# Patient Record
Sex: Female | Born: 1950 | Race: White | Hispanic: No | Marital: Married | Smoking: Never smoker
Health system: Southern US, Community
[De-identification: ages and names within clinical notes are randomized; demographics above are authoritative.]

## PROBLEM LIST (undated history)

## (undated) DIAGNOSIS — K6389 Other specified diseases of intestine: Secondary | ICD-10-CM

## (undated) DIAGNOSIS — R748 Abnormal levels of other serum enzymes: Secondary | ICD-10-CM

## (undated) DIAGNOSIS — Z923 Personal history of irradiation: Secondary | ICD-10-CM

## (undated) DIAGNOSIS — K52831 Collagenous colitis: Secondary | ICD-10-CM

## (undated) DIAGNOSIS — M8580 Other specified disorders of bone density and structure, unspecified site: Secondary | ICD-10-CM

## (undated) DIAGNOSIS — T7840XA Allergy, unspecified, initial encounter: Secondary | ICD-10-CM

## (undated) DIAGNOSIS — M1990 Unspecified osteoarthritis, unspecified site: Secondary | ICD-10-CM

## (undated) DIAGNOSIS — H269 Unspecified cataract: Secondary | ICD-10-CM

## (undated) DIAGNOSIS — C50211 Malignant neoplasm of upper-inner quadrant of right female breast: Secondary | ICD-10-CM

## (undated) DIAGNOSIS — C50919 Malignant neoplasm of unspecified site of unspecified female breast: Secondary | ICD-10-CM

## (undated) HISTORY — DX: Collagenous colitis: K52.831

## (undated) HISTORY — DX: Other specified disorders of bone density and structure, unspecified site: M85.80

## (undated) HISTORY — DX: Malignant neoplasm of upper-inner quadrant of right female breast: C50.211

## (undated) HISTORY — PX: COLONOSCOPY: SHX174

## (undated) HISTORY — DX: Unspecified cataract: H26.9

## (undated) HISTORY — PX: OTHER SURGICAL HISTORY: SHX169

## (undated) HISTORY — PX: TONSILLECTOMY: SUR1361

## (undated) HISTORY — PX: BLADDER REPAIR: SHX76

## (undated) HISTORY — PX: BREAST BIOPSY: SHX20

## (undated) HISTORY — PX: BREAST LUMPECTOMY: SHX2

## (undated) HISTORY — DX: Unspecified osteoarthritis, unspecified site: M19.90

## (undated) HISTORY — DX: Allergy, unspecified, initial encounter: T78.40XA

---
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
## 1999-07-26 HISTORY — PX: VAGINAL HYSTERECTOMY: SHX2639

## 2007-06-07 ENCOUNTER — Ambulatory Visit: Payer: Self-pay | Admitting: Internal Medicine

## 2007-06-20 ENCOUNTER — Ambulatory Visit: Payer: Self-pay | Admitting: Internal Medicine

## 2012-04-02 ENCOUNTER — Encounter: Payer: Self-pay | Admitting: Internal Medicine

## 2012-05-29 ENCOUNTER — Encounter: Payer: Self-pay | Admitting: Internal Medicine

## 2012-06-11 ENCOUNTER — Encounter: Payer: Self-pay | Admitting: Internal Medicine

## 2012-07-12 ENCOUNTER — Encounter: Payer: Self-pay | Admitting: Internal Medicine

## 2012-08-14 ENCOUNTER — Encounter: Payer: Self-pay | Admitting: Internal Medicine

## 2012-08-14 ENCOUNTER — Ambulatory Visit (AMBULATORY_SURGERY_CENTER): Payer: BC Managed Care – PPO

## 2012-08-14 VITALS — Ht 64.5 in | Wt 129.6 lb

## 2012-08-14 DIAGNOSIS — Z1211 Encounter for screening for malignant neoplasm of colon: Secondary | ICD-10-CM

## 2012-08-14 DIAGNOSIS — Z800 Family history of malignant neoplasm of digestive organs: Secondary | ICD-10-CM

## 2012-08-14 MED ORDER — NA SULFATE-K SULFATE-MG SULF 17.5-3.13-1.6 GM/177ML PO SOLN: 1.0000 | Freq: Once | ORAL | Status: DC

## 2012-08-24 ENCOUNTER — Ambulatory Visit (AMBULATORY_SURGERY_CENTER): Payer: BC Managed Care – PPO | Admitting: Internal Medicine

## 2012-08-24 ENCOUNTER — Encounter: Payer: Self-pay | Admitting: Internal Medicine

## 2012-08-24 VITALS — BP 122/77 | HR 67 | Temp 97.7°F | Resp 17 | Ht 64.5 in | Wt 129.0 lb

## 2012-08-24 DIAGNOSIS — Z800 Family history of malignant neoplasm of digestive organs: Secondary | ICD-10-CM

## 2012-08-24 DIAGNOSIS — Z1211 Encounter for screening for malignant neoplasm of colon: Secondary | ICD-10-CM

## 2012-08-24 MED ORDER — SODIUM CHLORIDE 0.9 % IV SOLN: 500.0000 mL | INTRAVENOUS | Status: DC

## 2012-08-24 NOTE — Progress Notes (Signed)
No complaints noted in the recovery room. Maw   

## 2012-08-24 NOTE — Progress Notes (Signed)
Patient did not experience any of the following events: a burn prior to discharge; a fall within the facility; wrong site/side/patient/procedure/implant event; or a hospital transfer or hospital admission upon discharge from the facility. (G8907) Patient did not have preoperative order for IV antibiotic SSI prophylaxis. (G8918)  

## 2012-08-24 NOTE — Op Note (Signed)
[facility] [address] [place], [address]   COLONOSCOPY PROCEDURE REPORT  PATIENT: Robyn Hobbs, Robyn Hobbs  MR#: [id number] BIRTHDATE: 24-Oct-1950 , 61  yrs. old GENDER: Female ENDOSCOPIST: Iva Boop, MD, [facility] PROCEDURE DATE:  08/24/2012 PROCEDURE:   Colonoscopy, diagnostic ASA CLASS:   Class II INDICATIONS:elevated risk screening and Patient's family history of colon cancer, distant relatives. MEDICATIONS: propofol (Diprivan) 200mg  IV, MAC sedation, administered by CRNA, and These medications were titrated to patient response per physician's verbal order  DESCRIPTION OF PROCEDURE:   After the risks benefits and alternatives of the procedure were thoroughly explained, informed consent was obtained.  A digital rectal exam revealed no abnormalities of the rectum.   The LB [id number]  endoscope was introduced through the anus and advanced to the cecum, which was identified by both the appendix and ileocecal valve. No adverse events experienced.   The quality of the prep was Suprep excellent The instrument was then slowly withdrawn as the colon was fully examined.      COLON FINDINGS: A normal appearing cecum, ileocecal valve, and appendiceal orifice were identified.  The ascending, hepatic flexure, transverse, splenic flexure, descending, sigmoid colon and rectum appeared unremarkable.  No polyps or cancers were seen. Retroflexed views revealed no abnormalities. Photo was lost.The time to cecum=4 minutes 05 seconds.  Withdrawal time=8 minutes 54 seconds.  The scope was withdrawn and the procedure completed. COMPLICATIONS: There were no complications.  ENDOSCOPIC IMPRESSION: Normal colonoscopy, excellent prep  RECOMMENDATIONS: Repeat Colonoscopy in 5 years because of family history of colon cancer in 3 grandparents   eSigned:  Iva Boop, MD, [facility] 08/24/2012 10:37 AM   cc: Dewain Penning MD and The Patient

## 2012-08-24 NOTE — Patient Instructions (Addendum)
The colonoscopy was normal with a great prep! I will plan to reassess your situation in 5 years again - guidelines are not perfect but with 3 grandparents having colon cancer this still seems reasonable.  Thank you for choosing me and [facility].  Iva Boop, MD, [facility]    You may resume your current medications today.  Please call if any questions or concerns.    YOU HAD AN ENDOSCOPIC PROCEDURE TODAY AT THE [facility]: Refer to the procedure report that was given to you for any specific questions about what was found during the examination.  If the procedure report does not answer your questions, please call your gastroenterologist to clarify.  If you requested that your care partner not be given the details of your procedure findings, then the procedure report has been included in a sealed envelope for you to review at your convenience later.  YOU SHOULD EXPECT: Some feelings of bloating in the abdomen. Passage of more gas than usual.  Walking can help get rid of the air that was put into your GI tract during the procedure and reduce the bloating. If you had a lower endoscopy (such as a colonoscopy or flexible sigmoidoscopy) you may notice spotting of blood in your stool or on the toilet paper. If you underwent a bowel prep for your procedure, then you may not have a normal bowel movement for a few days.  DIET: Your first meal following the procedure should be a light meal and then it is ok to progress to your normal diet.  A half-sandwich or bowl of soup is an example of a good first meal.  Heavy or fried foods are harder to digest and may make you feel nauseous or bloated.  Likewise meals heavy in dairy and vegetables can cause extra gas to form and this can also increase the bloating.  Drink plenty of fluids but you should avoid alcoholic beverages for 24 hours.  ACTIVITY: Your care partner should take you home directly after the procedure.  You should plan to  take it easy, moving slowly for the rest of the day.  You can resume normal activity the day after the procedure however you should NOT DRIVE or use heavy machinery for 24 hours (because of the sedation medicines used during the test).    SYMPTOMS TO REPORT IMMEDIATELY: A gastroenterologist can be reached at any hour.  During normal business hours, 8:30 AM to 5:00 PM Monday through Friday, call [phone].  After hours and on weekends, please call the GI answering service at [phone] who will take a message and have the physician on call contact you.   Following lower endoscopy (colonoscopy or flexible sigmoidoscopy):  Excessive amounts of blood in the stool  Significant tenderness or worsening of abdominal pains  Swelling of the abdomen that is new, acute  Fever of 100F or higher    Black, tarry-looking stools  FOLLOW UP: If any biopsies were taken you will be contacted by phone or by letter within the next 1-3 weeks.  Call your gastroenterologist if you have not heard about the biopsies in 3 weeks.  Our staff will call the home number listed on your records the next business day following your procedure to check on you and address any questions or concerns that you may have at that time regarding the information given to you following your procedure. This is a courtesy call and so if there is no answer at the home number  and we have not heard from you through the emergency physician on call, we will assume that you have returned to your regular daily activities without incident.  SIGNATURES/CONFIDENTIALITY: You and/or your care partner have signed paperwork which will be entered into your electronic medical record.  These signatures attest to the fact that that the information above on your After Visit Summary has been reviewed and is understood.  Full responsibility of the confidentiality of this discharge information lies with you and/or your care-partner.

## 2012-08-24 NOTE — Progress Notes (Signed)
Pt stable to RR 

## 2012-08-27 ENCOUNTER — Telehealth: Payer: Self-pay | Admitting: *Deleted

## 2012-08-27 NOTE — Telephone Encounter (Signed)
  Follow up Call-  Call back number 08/24/2012  Post procedure Call Back phone  # [phone]  Permission to leave phone message Yes     Patient questions:  Do you have a fever, pain , or abdominal swelling? no Pain Score  0 *  Have you tolerated food without any problems? yes  Have you been able to return to your normal activities? yes  Do you have any questions about your discharge instructions: Diet   no Medications  no Follow up visit  no  Do you have questions or concerns about your Care? no  Actions: * If pain score is 4 or above: No action needed, pain <4.

## 2015-12-31 DIAGNOSIS — M8580 Other specified disorders of bone density and structure, unspecified site: Secondary | ICD-10-CM | POA: Insufficient documentation

## 2016-01-01 DIAGNOSIS — K581 Irritable bowel syndrome with constipation: Secondary | ICD-10-CM | POA: Insufficient documentation

## 2016-02-17 ENCOUNTER — Other Ambulatory Visit: Payer: Self-pay | Admitting: Obstetrics and Gynecology

## 2016-02-17 DIAGNOSIS — N6310 Unspecified lump in the right breast, unspecified quadrant: Secondary | ICD-10-CM

## 2016-02-18 ENCOUNTER — Other Ambulatory Visit: Payer: BC Managed Care – PPO

## 2016-02-19 ENCOUNTER — Ambulatory Visit
Admission: RE | Admit: 2016-02-19 | Discharge: 2016-02-19 | Disposition: A | Discharge status: Home / Self Care | Payer: Medicare HMO | Source: Ambulatory Visit | Attending: Obstetrics and Gynecology | Admitting: Obstetrics and Gynecology

## 2016-02-19 ENCOUNTER — Other Ambulatory Visit: Payer: Self-pay | Admitting: Obstetrics and Gynecology

## 2016-02-19 DIAGNOSIS — N6310 Unspecified lump in the right breast, unspecified quadrant: Secondary | ICD-10-CM

## 2016-02-24 ENCOUNTER — Telehealth: Payer: Self-pay | Admitting: *Deleted

## 2016-02-24 NOTE — Telephone Encounter (Signed)
Left vm for pt to return call for [facility] on 03/02/16

## 2016-02-25 ENCOUNTER — Telehealth: Payer: Self-pay | Admitting: *Deleted

## 2016-02-25 ENCOUNTER — Encounter: Payer: Self-pay | Admitting: *Deleted

## 2016-02-25 DIAGNOSIS — C50211 Malignant neoplasm of upper-inner quadrant of right female breast: Secondary | ICD-10-CM

## 2016-02-25 DIAGNOSIS — Z170 Estrogen receptor positive status [ER+]: Secondary | ICD-10-CM

## 2016-02-25 HISTORY — DX: Malignant neoplasm of upper-inner quadrant of right female breast: C50.211

## 2016-02-25 NOTE — Telephone Encounter (Signed)
Confirmed [facility] for 03/02/16 at 1215 .  Instructions and contact information given.

## 2016-03-02 ENCOUNTER — Encounter: Payer: Self-pay | Admitting: Physical Therapy

## 2016-03-02 ENCOUNTER — Ambulatory Visit: Payer: Medicare HMO | Attending: General Surgery | Admitting: Physical Therapy

## 2016-03-02 ENCOUNTER — Ambulatory Visit (HOSPITAL_BASED_OUTPATIENT_CLINIC_OR_DEPARTMENT_OTHER): Payer: Medicare HMO | Admitting: Oncology

## 2016-03-02 ENCOUNTER — Other Ambulatory Visit (HOSPITAL_BASED_OUTPATIENT_CLINIC_OR_DEPARTMENT_OTHER): Payer: Medicare HMO

## 2016-03-02 ENCOUNTER — Encounter: Payer: Self-pay | Admitting: Genetic Counselor

## 2016-03-02 ENCOUNTER — Other Ambulatory Visit: Payer: Self-pay | Admitting: General Surgery

## 2016-03-02 ENCOUNTER — Ambulatory Visit
Admission: RE | Admit: 2016-03-02 | Discharge: 2016-03-02 | Disposition: A | Discharge status: Home / Self Care | Payer: Medicare HMO | Source: Ambulatory Visit | Attending: Radiation Oncology | Admitting: Radiation Oncology

## 2016-03-02 VITALS — BP 138/77 | HR 69 | Temp 97.7°F | Resp 20 | Ht 64.5 in | Wt 129.1 lb

## 2016-03-02 DIAGNOSIS — Z170 Estrogen receptor positive status [ER+]: Secondary | ICD-10-CM | POA: Diagnosis not present

## 2016-03-02 DIAGNOSIS — M8580 Other specified disorders of bone density and structure, unspecified site: Secondary | ICD-10-CM

## 2016-03-02 DIAGNOSIS — C50411 Malignant neoplasm of upper-outer quadrant of right female breast: Secondary | ICD-10-CM

## 2016-03-02 DIAGNOSIS — C50211 Malignant neoplasm of upper-inner quadrant of right female breast: Secondary | ICD-10-CM

## 2016-03-02 LAB — COMPREHENSIVE METABOLIC PANEL
ALBUMIN: 4.4 g/dL (ref 3.5–5.0)
ALK PHOS: 62 U/L (ref 40–150)
ALT: 26 U/L (ref 0–55)
AST: 20 U/L (ref 5–34)
Anion Gap: 10 mEq/L (ref 3–11)
BILIRUBIN TOTAL: 0.57 mg/dL (ref 0.20–1.20)
BUN: 18.8 mg/dL (ref 7.0–26.0)
CO2: 27 mEq/L (ref 22–29)
Calcium: 9.8 mg/dL (ref 8.4–10.4)
Chloride: 102 mEq/L (ref 98–109)
Creatinine: 1 mg/dL (ref 0.6–1.1)
EGFR: 57 mL/min/{1.73_m2} — ABNORMAL LOW (ref >90)
GLUCOSE: 168 mg/dL — AB (ref 70–140)
Potassium: 4 mEq/L (ref 3.5–5.1)
SODIUM: 139 meq/L (ref 136–145)
TOTAL PROTEIN: 7.3 g/dL (ref 6.4–8.3)

## 2016-03-02 LAB — CBC WITH DIFFERENTIAL/PLATELET
BASO%: 0.4 % (ref 0.0–2.0)
Basophils Absolute: 0 10*3/uL (ref 0.0–0.1)
EOS%: 1.1 % (ref 0.0–7.0)
Eosinophils Absolute: 0.1 10*3/uL (ref 0.0–0.5)
HCT: 43.4 % (ref 34.8–46.6)
HEMOGLOBIN: 14.1 g/dL (ref 11.6–15.9)
LYMPH%: 15.5 % (ref 14.0–49.7)
MCH: 28.2 pg (ref 25.1–34.0)
MCHC: 32.6 g/dL (ref 31.5–36.0)
MCV: 86.7 fL (ref 79.5–101.0)
MONO#: 0.4 10*3/uL (ref 0.1–0.9)
MONO%: 4.3 % (ref 0.0–14.0)
NEUT%: 78.7 % — ABNORMAL HIGH (ref 38.4–76.8)
NEUTROS ABS: 6.8 10*3/uL — AB (ref 1.5–6.5)
Platelets: 218 10*3/uL (ref 145–400)
RBC: 5 10*6/uL (ref 3.70–5.45)
RDW: 13.6 % (ref 11.2–14.5)
WBC: 8.7 10*3/uL (ref 3.9–10.3)
lymph#: 1.3 10*3/uL (ref 0.9–3.3)

## 2016-03-02 NOTE — Progress Notes (Signed)
Radiation Oncology         [phone]) [phone] ________________________________  Initial Outpatient consultation  Name: Robyn Hobbs MRN: [id number]  Date: 03/02/2016  DOB: 1950-12-25  KG:[id number] Jerilynn Mages, MD  Karlene Einstein, MD   REFERRING PHYSICIAN: Karlene Einstein, MD  DIAGNOSIS:    ICD-9-CM ICD-10-CM   1. 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 The patient was found to have a mass right breast on screening mammography. Biopsy showed invasive ductal carcinoma and carcinoma in situ with characteristics as described above in the diagnosis. It was ER+, PR negative, HER-2 negative. Ultrasound- appreciated a mass in the 12:30 o'clock location of the right breast: affirmed a 72m mass in the 12:30 location.  The right axilla was negative for malignancy.   The patient is here today to discuss her options for treatment and radiotherapy with me.    PAST MEDICAL HISTORY:  has a past medical history of Breast cancer of upper-inner quadrant of right female breast ([facility] (02/25/2016).    PAST SURGICAL HISTORY: Past Surgical History:  Procedure Laterality Date  . COLONOSCOPY  multiple  . TONSILLECTOMY  1958  . VAGINAL HYSTERECTOMY  2001    FAMILY HISTORY: family history includes Colon cancer in her maternal grandmother, paternal grandfather, and paternal grandmother; Kidney disease in her father.  SOCIAL HISTORY:  reports that she has never smoked. She has never used smokeless tobacco. She reports that she drinks about 1.8 oz of alcohol per week . She reports that she does not use drugs.  ALLERGIES: Morphine and related  MEDICATIONS:  Current Outpatient Prescriptions  Medication Sig Dispense  Refill  . beta carotene w/minerals (OCUVITE) tablet Take 1 tablet by mouth daily.    . calcium carbonate (OS-CAL) 600 MG TABS Take 600 mg by mouth 2 (two) times daily with a meal.    . cholecalciferol (VITAMIN D) 1000 UNITS tablet Take 2,000 Units by mouth daily.    . Multiple Vitamin (MULTIVITAMIN) tablet Take 1 tablet by mouth daily.     No current facility-administered medications for this encounter.     REVIEW OF SYSTEMS:  Notable for that above. A 15 point review of system was obtained by nursing and reviewed by me. Positive for contacts, hearing loss, hearing aid, dribbling urine, and lump in breast, otherwise negative.   Vitals with BMI 03/02/2016  Height 5' 4.5"  Weight 129 lbs 2 oz  BMI 244.0 Systolic 1347 Diastolic 77  Pulse 69  Respirations 20     PHYSICAL EXAM:  General: Alert and oriented, in no acute distress HEENT: Head is normocephalic. Extraocular movements are intact. Oropharynx is clear. Neck: Neck is supple, no palpable cervical or supraclavicular lymphadenopathy. Heart: Regular in rate and rhythm with no murmurs, rubs, or gallops. Chest: Clear to auscultation bilaterally, with no rhonchi, wheezes, or rales. Abdomen: Soft, nontender, nondistended, with no rigidity or guarding. Extremities: No cyanosis or edema. Lymphatics: see Neck Exam Skin: No concerning lesions. Musculoskeletal: symmetric strength and muscle tone throughout. Neurologic: Cranial nerves II through XII are grossly intact. No obvious focalities. Speech is fluent. Coordination is intact. Psychiatric: Judgment and insight are intact. Affect is appropriate. Breasts:  No palpable masses appreciated in the breasts or axillae.    ECOG = 0  0 - Asymptomatic (Fully active, able to carry on all predisease activities without restriction)  1 - Symptomatic but completely ambulatory (Restricted in physically strenuous activity but ambulatory and able to carry out work of a light or sedentary nature. For  example, light housework, office work)  2 - Symptomatic, <50% in bed during the day (Ambulatory and capable of all self care but unable to carry out any work activities. Up and about more than 50% of waking hours)  3 - Symptomatic, >50% in bed, but not bedbound (Capable of only limited self-care, confined to bed or chair 50% or more of waking hours)  4 - Bedbound (Completely disabled. Cannot carry on any self-care. Totally confined to bed or chair)  5 - Death   Eustace Pen MM, Creech RH, Tormey DC, et al. [phone]). "Toxicity and response criteria of the [facility]". [place] Oncol. 5 (6): 649-55   LABORATORY DATA:  Lab Results  Component Value Date   WBC 8.7 03/02/2016   HGB 14.1 03/02/2016   HCT 43.4 03/02/2016   MCV 86.7 03/02/2016   PLT 218 03/02/2016   CMP     Component Value Date/Time   NA 139 03/02/2016 1238   K 4.0 03/02/2016 1238   CO2 27 03/02/2016 1238   GLUCOSE 168 (H) 03/02/2016 1238   BUN 18.8 03/02/2016 1238   CREATININE 1.0 03/02/2016 1238   CALCIUM 9.8 03/02/2016 1238   PROT 7.3 03/02/2016 1238   ALBUMIN 4.4 03/02/2016 1238   AST 20 03/02/2016 1238   ALT 26 03/02/2016 1238   ALKPHOS 62 03/02/2016 1238   BILITOT 0.57 03/02/2016 1238         RADIOGRAPHY: Mm Diag Breast Tomo Uni Right  Result Date: 02/19/2016 CLINICAL DATA:  Status post ultrasound-guided core biopsy of mass in the 12:30 o'clock location of the right breast. EXAM: DIAGNOSTIC RIGHT MAMMOGRAM POST ULTRASOUND BIOPSY COMPARISON:  Previous exam(s). FINDINGS: Mammographic images were obtained following ultrasound guided biopsy of mass in the 12:30 o'clock location of the right breast. A ribbon shaped clip is identified in the upper inner quadrant of the right breast as expected. IMPRESSION: Tissue marker clip in the expected location following biopsy. Final Assessment: Post Procedure Mammograms for Marker Placement Electronically Signed   By: Nolon Nations M.D.   On: 02/19/2016  15:49  [place] Rt Breast Bx W Loc Dev 1st Lesion Img Bx Spec US Guide  Addendum Date: 02/22/2016   ADDENDUM REPORT: 02/22/2016 16:23 ADDENDUM: PATHOLOGY ADDENDUM: Pathology right breast 12:30 o'clock location: Invasive and in situ ductal carcinoma, likely grade 1 Pathology concordance with imaging findings: Yes Recommendation: Consultation regarding treatment plan. At the patient's request, an appointment has been made for the patient at the [facility] on 03/02/2016. At the request of the patient, I spoke with her by telephone on 02/22/2016 at 11:40 a.m. She reports doing well after the biopsy . Electronically Signed   By: Nolon Nations M.D.   On: 02/22/2016 16:23   Result Date: 02/22/2016 CLINICAL DATA:  Patient presents for ultrasound-guided core biopsy of right breast mass. EXAM: ULTRASOUND GUIDED RIGHT BREAST CORE NEEDLE BIOPSY COMPARISON:  Prior studies dated 01/2016 and earlier from [place]: I met with the patient and we discussed the procedure of ultrasound-guided biopsy, including benefits and alternatives. We discussed the high likelihood of a successful procedure. We discussed the risks  of the procedure, including infection, bleeding, tissue injury, clip migration, and inadequate sampling. Informed written consent was given. The usual time-out protocol was performed immediately prior to the procedure. Using sterile technique and 1% Lidocaine as local anesthetic, under direct ultrasound visualization, a 12 gauge spring-loaded device was used to perform biopsy of mass in the 12:30 o'clock location of the right breast using a medial approach. At the conclusion of the procedure a ribbon tissue marker clip was deployed into the biopsy cavity. Follow up 2 view mammogram was performed and dictated separately. IMPRESSION: Ultrasound guided biopsy of right breast mass. No apparent complications. Electronically Signed: By: Nolon Nations  M.D. On: 02/19/2016 15:48      IMPRESSION/PLAN:  Stage I right breast cancer, ER+  She has been discussed at our multidisciplinary tumor board.  The consensus is that she would be a good candidate for breast conservation. Her expected overall survival would be equivalent between mastectomy and breast conservation, based upon randomized controlled data. She is enthusiastic about breast conservation.  It was a pleasure meeting the patient today. We discussed the risks, benefits, and side effects of radiotherapy. I recommend radiotherapy to the right breast to reduce her risk of locoregional recurrence by 2/3.  We discussed that radiation would take approximately 4-6 weeks to complete and that I would give the patient a few weeks to heal following surgery before starting treatment planning.  If chemotherapy were to be given, this would precede radiotherapy. We spoke about acute effects including skin irritation and fatigue as well as much less common late effects including internal organ injury or irritation. We spoke about the latest technology that is used to minimize the risk of late effects for patients undergoing radiotherapy to the breast or chest wall. No guarantees of treatment were given. The patient is enthusiastic about proceeding with treatment. I look forward to participating in the patient's care.    __________________________________________   Eppie Gibson, MD    This document serves as a record of services personally performed by Eppie Gibson , MD. It was created on her behalf by Truddie Hidden, a trained medical scribe. The creation of this record is based on the scribe's personal observations and the provider's statements to them. This document has been checked and approved by the attending provider.

## 2016-03-02 NOTE — Patient Instructions (Signed)

## 2016-03-02 NOTE — Progress Notes (Signed)
[place]  [phone] [phone]     ID: CHIANTE PEDEN DOB: 1950-12-15  MR#: [id number]  QPR#:[id number]  Patient Care Team: Karlene Einstein, MD as PCP - General (Family Medicine) Autumn Messing III, MD as Consulting Physician (General Surgery) Chauncey Cruel, MD as Consulting Physician (Oncology) Eppie Gibson, MD as Attending Physician (Radiation Oncology) Luellen Pucker, MD (Obstetrics and Gynecology) Amy Martinique, MD as Consulting Physician (Dermatology) OTHER MD:  CHIEF COMPLAINT: Estrogen receptor positive breast cancer  CURRENT TREATMENT: Awaiting definitive surgery  BREAST CANCER HISTORY: Areeba had screening mammography suggesting a possible mass in the right breast and [facility] diagnostic mammography with tomography and right breast ultrasonography was performed 02/17/2016. The breast density was category C. There was set up mass in the posterior third of the upper central right breast which was not palpable by exam. Ultrasonography confirmed an irregular hypoechoic mass measuring approximately 0.9 cm. Survey of the right axilla was unremarkable.  Biopsy of the right breast mass in question 02/19/2016 showed ([id number]) an invasive ductal carcinoma, grade 1, estrogen receptor 100% positive, with strong staining intensity, progesterone receptor negative, with an MIB-1 of 2%, and no HER-2 amplification, the signals ratio being 1.00 and the number per cell 1.05.  Her subsequent history is as detailed below.  INTERVAL HISTORY: Oluwaseun was evaluated in the multidisciplinary breast cancer clinic 03/02/2016 accompanied by her husband Mikki Santee. Her case was also presented in the multidisciplinary breast cancer conference that same morning. At that time a preliminary plan was proposed: Breast conserving surgery with sentinel lymph node sampling, consideration of Oncotype, followed by adjuvant radiation. The patient did  not meet genetics criteria.  REVIEW OF SYSTEMS: There were no specific symptoms leading to the original mammogram, which was routinely scheduled. The patient denies unusual headaches, visual changes, nausea, vomiting, stiff neck, dizziness, or gait imbalance. There has been no cough, phlegm production, or pleurisy, no chest pain or pressure, and no change in bowel or bladder habits. The patient denies fever, rash, bleeding, unexplained fatigue or unexplained weight loss. She admits to presbycusis and some stress urinary incontinence. A detailed review of systems was otherwise entirely negative.   PAST MEDICAL HISTORY: Past Medical History:  Diagnosis Date  . Breast cancer of upper-inner quadrant of right female breast ([place]) 02/25/2016    PAST SURGICAL HISTORY: Past Surgical History:  Procedure Laterality Date  . COLONOSCOPY  multiple  . TONSILLECTOMY  1958  . VAGINAL HYSTERECTOMY  2001    FAMILY HISTORY Family History  Problem Relation Age of Onset  . Kidney disease Father   . Colon cancer Maternal Grandmother   . Colon cancer Paternal Grandfather   . Colon cancer Paternal Grandmother   The patient's father died at the age of 49 from kidney cancer. The patient's mother died at the age of 61. The patient had one brother, no sisters. On the mother's side both the grandmother and grandfather had colon cancer diagnosed in their 61s. The patient's brother had prostate cancer at age 1. The patient's paternal grandfather was diagnosed with lung cancer in his late 41s. There is no history of breast or ovarian cancer in the family  GYNECOLOGIC HISTORY:  No LMP recorded. Menarche age 89, first live birth age 33, the patient is [place] P3. She underwent fall abdominal hysterectomy with bilateral salpingo-oophorectomy June 2001. She took hormone replacement for less than a year, stopping in 2002. She used oral contraceptives for approximately 5  years remotely without complications.  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 Her husband Reynolds Bowl") used to work in [employer] but is now retired. Son [name] lives in [place] and is a Hydrologist for [employer], son [name] lives in [place] and is a serious of for Multimedia programmer, Jacqulynn Cadet Lives in [place] Where He Works in [employer]. The Patient Has 6 Grandchildren. She Attends a [employer].    ADVANCED DIRECTIVES: In place   HEALTH MAINTENANCE: Social History  Substance Use Topics  . Smoking status: Never Smoker  . Smokeless tobacco: Never Used  . Alcohol use 1.8 oz/week    3 Glasses of wine per week     Colonoscopy: 2014/lobe our  PAP:  Bone density: July 2017   Allergies  Allergen Reactions  . Morphine And Related Swelling    Current Outpatient Prescriptions  Medication Sig Dispense Refill  . beta carotene w/minerals (OCUVITE) tablet Take 1 tablet by mouth daily.    . calcium carbonate (OS-CAL) 600 MG TABS Take 600 mg by mouth 2 (two) times daily with a meal.    . cholecalciferol (VITAMIN D) 1000 UNITS tablet Take 2,000 Units by mouth daily.    . Multiple Vitamin (MULTIVITAMIN) tablet Take 1 tablet by mouth daily.     No current facility-administered medications for this visit.     OBJECTIVE: Middle-aged white woman who appears stated age 65:   03/02/16 1252  BP: 138/77  Pulse: 69  Resp: 20  Temp: 97.7 F (36.5 C)     Body mass index is 21.82 kg/m.    ECOG FS:0 - Asymptomatic  Ocular: Sclerae unicteric, pupils equal, round and reactive to light Ear-nose-throat: Oropharynx clear and moist Lymphatic: No cervical or supraclavicular adenopathy Lungs no rales or rhonchi, good excursion bilaterally Heart regular rate and rhythm, no murmur appreciated Abd soft, nontender, positive bowel sounds MSK no focal spinal tenderness, no joint edema Neuro: non-focal, well-oriented, appropriate affect Breasts: The right breast is status post recent  biopsy. There are no findings of concern and particularly no palpable masses. The right axilla is benign. The left breast is unremarkable.   LAB RESULTS:  CMP     Component Value Date/Time   NA 139 03/02/2016 1238   K 4.0 03/02/2016 1238   CO2 27 03/02/2016 1238   GLUCOSE 168 (H) 03/02/2016 1238   BUN 18.8 03/02/2016 1238   CREATININE 1.0 03/02/2016 1238   CALCIUM 9.8 03/02/2016 1238   PROT 7.3 03/02/2016 1238   ALBUMIN 4.4 03/02/2016 1238   AST 20 03/02/2016 1238   ALT 26 03/02/2016 1238   ALKPHOS 62 03/02/2016 1238   BILITOT 0.57 03/02/2016 1238    INo results found for: SPEP, UPEP  Lab Results  Component Value Date   WBC 8.7 03/02/2016   NEUTROABS 6.8 (H) 03/02/2016   HGB 14.1 03/02/2016   HCT 43.4 03/02/2016   MCV 86.7 03/02/2016   PLT 218 03/02/2016      Chemistry      Component Value Date/Time   NA 139 03/02/2016 1238   K 4.0 03/02/2016 1238   CO2 27 03/02/2016 1238   BUN 18.8 03/02/2016 1238   CREATININE 1.0 03/02/2016 1238      Component Value Date/Time   CALCIUM 9.8 03/02/2016 1238   ALKPHOS 62 03/02/2016 1238   AST 20 03/02/2016 1238   ALT 26 03/02/2016 1238   BILITOT 0.57 03/02/2016 1238  No results found for: LABCA2  No components found for: [id number]  No results for input(s): INR in the last 168 hours.  Urinalysis No results found for: COLORURINE, APPEARANCEUR, LABSPEC, PHURINE, GLUCOSEU, HGBUR, BILIRUBINUR, KETONESUR, PROTEINUR, UROBILINOGEN, NITRITE, LEUKOCYTESUR   STUDIES: Mm Diag Breast Tomo Uni Right  Result Date: 02/19/2016 CLINICAL DATA:  Status post ultrasound-guided core biopsy of mass in the 12:30 o'clock location of the right breast. EXAM: DIAGNOSTIC RIGHT MAMMOGRAM POST ULTRASOUND BIOPSY COMPARISON:  Previous exam(s). FINDINGS: Mammographic images were obtained following ultrasound guided biopsy of mass in the 12:30 o'clock location of the right breast. A ribbon shaped clip is identified in the upper inner quadrant of the  right breast as expected. IMPRESSION: Tissue marker clip in the expected location following biopsy. Final Assessment: Post Procedure Mammograms for Marker Placement Electronically Signed   By: Nolon Nations M.D.   On: 02/19/2016 15:49  [place] Rt Breast Bx W Loc Dev 1st Lesion Img Bx Spec US Guide  Addendum Date: 02/22/2016   ADDENDUM REPORT: 02/22/2016 16:23 ADDENDUM: PATHOLOGY ADDENDUM: Pathology right breast 12:30 o'clock location: Invasive and in situ ductal carcinoma, likely grade 1 Pathology concordance with imaging findings: Yes Recommendation: Consultation regarding treatment plan. At the patient's request, an appointment has been made for the patient at the [facility] on 03/02/2016. At the request of the patient, I spoke with her by telephone on 02/22/2016 at 11:40 a.m. She reports doing well after the biopsy . Electronically Signed   By: Nolon Nations M.D.   On: 02/22/2016 16:23   Result Date: 02/22/2016 CLINICAL DATA:  Patient presents for ultrasound-guided core biopsy of right breast mass. EXAM: ULTRASOUND GUIDED RIGHT BREAST CORE NEEDLE BIOPSY COMPARISON:  Prior studies dated 01/2016 and earlier from [place]: I met with the patient and we discussed the procedure of ultrasound-guided biopsy, including benefits and alternatives. We discussed the high likelihood of a successful procedure. We discussed the risks of the procedure, including infection, bleeding, tissue injury, clip migration, and inadequate sampling. Informed written consent was given. The usual time-out protocol was performed immediately prior to the procedure. Using sterile technique and 1% Lidocaine as local anesthetic, under direct ultrasound visualization, a 12 gauge spring-loaded device was used to perform biopsy of mass in the 12:30 o'clock location of the right breast using a medial approach. At the conclusion of the procedure a ribbon tissue marker clip  was deployed into the biopsy cavity. Follow up 2 view mammogram was performed and dictated separately. IMPRESSION: Ultrasound guided biopsy of right breast mass. No apparent complications. Electronically Signed: By: Nolon Nations M.D. On: 02/19/2016 15:48    ELIGIBLE FOR AVAILABLE RESEARCH PROTOCOL: no  ASSESSMENT: 65 y.o. [place], [place] woman status post right breast upper inner quadrant biopsy 02/19/2016 for a clinical T1b N0, tage IA s invasive ductal carcinoma, grade 1, Estrogen receptor positive, progesterone receptor and HER-2 negative, with an MIB-1 of 2.   Illnesses 1) definitive surgery pending  (2) adjuvant radiation to follow as appropriate  (3) adjuvant anti-estrogens to follow at the completion of local treatment.  PLAN: We spent the better part of today's hour-long appointment discussing the biology of breast cancer in general, and the specifics of the patient's tumor in particular. We first reviewed the fact that cancer is not one disease but more than 100 different diseases and that it is important to keep them separate-- otherwise when friends and relatives discuss their own cancer experiences with [place] confusion can result.  Similarly we explained that if breast cancer spreads to the bone or liver, the patient would not have bone cancer or liver cancer, but breast cancer in the bone and breast cancer in the liver: one cancer in three places-- not 3 different cancers which otherwise would have to be treated in 3 different ways.  We discussed the difference between local and systemic therapy. In terms of loco-regional treatment, lumpectomy plus radiation is equivalent to mastectomy as far as survival is concerned. For this reason, and because the cosmetic results are generally superior, we recommend breast conserving surgery.  We then discussed the rationale for systemic therapy. There is some risk that this cancer may have already spread to other parts of her body. Patients  frequently ask at this point about bone scans, CAT scans and PET scans to find out if they have occult breast cancer somewhere else. The problem is that in early stage disease we are much more likely to find false positives then true cancers and this would expose the patient to unnecessary procedures as well as unnecessary radiation. Scans cannot answer the question the patient really would like to know, which is whether she has microscopic disease elsewhere in her body. For those reasons we do not recommend them.  Of course we would proceed to aggressive evaluation of any symptoms that might suggest metastatic disease, but that is not the case here.  Next we went over the options for systemic therapy which are anti-estrogens, anti-HER-2 immunotherapy, and chemotherapy. Lavida does not meet criteria for anti-HER-2 immunotherapy. She is a good candidate for anti-estrogens.  The question of chemotherapy is more complicated. Chemotherapy is most effective in rapidly growing, aggressive tumors. It is much less effective in low-grade, slow growing cancers, like Jaretssi 's. For that reason we are going to request an Oncotype from the definitive surgical sample, as suggested by NCCN guidelines. That will help [place] make a definitive decision regarding chemotherapy in this case.  After much discussion, then, the plan for surgery, Oncotype testing, followed by radiation and then anti-estrogens was clarified.  Nancyt has a good understanding of the overall plan. She agrees with it. She knows the goal of treatment in her case is cure. She will call with any problems that may develop before her next visit here which will be in late October, by which time she should be done with her local treatment and ready to start antiestrogen therapy   Chauncey Cruel, MD   03/02/2016 6:58 PM Medical Oncology and Hematology [facility] [place], [place] [address] Tel. [phone]    Fax.  [phone]

## 2016-03-02 NOTE — Therapy (Signed)
[place], [place], [address] Phone: [phone]   Fax:  [phone]  Physical Therapy Evaluation  Patient Details  Name: Robyn Hobbs MRN: [id number] Date of Birth: 05-Apr-1951 Referring Provider: Dr. Autumn Messing  Encounter Date: 03/02/2016      PT End of Session - 03/02/16 1526    Visit Number 1   Number of Visits 1   PT Start Time 1316   PT Stop Time 1344   PT Time Calculation (min) 28 min   Activity Tolerance Patient tolerated treatment well   Behavior During Therapy [facility] for tasks assessed/performed      Past Medical History:  Diagnosis Date  . Breast cancer of upper-inner quadrant of right female breast ([place]) 02/25/2016    Past Surgical History:  Procedure Laterality Date  . COLONOSCOPY  multiple  . TONSILLECTOMY  1958  . VAGINAL HYSTERECTOMY  2001    There were no vitals filed for this visit.       Subjective Assessment - 03/02/16 1516    Subjective Patient reports she is here today to be seen by her medical team for her newly diagnosed right breast cancer.   Patient is accompained by: Family member   Pertinent History Patient was diagnosed on 02/11/16 with right grade 1 invasive ductal carcinoma breast cancer.  It is ER positive, PR negative, and HER2 negative.  It is located in the upper outer quadrant and measures 9 mm with a Ki67 of 2%.    Patient Stated Goals Reduce lymphedema risk and learn post op shoulder ROM HEP   Currently in Pain? No/denies            [facility] PT Assessment - 03/02/16 0001      Assessment   Medical Diagnosis Right breast cancer   Referring Provider Dr. Autumn Messing   Onset Date/Surgical Date 02/11/16   Hand Dominance Right   Prior Therapy none     Precautions   Precautions Other (comment)   Precaution Comments Active cancer     Restrictions   Weight Bearing Restrictions No     Balance Screen   Has the patient fallen in the past 6 months No   Has  the patient had a decrease in activity level because of a fear of falling?  No   Is the patient reluctant to leave their home because of a fear of falling?  No     Home Ecologist residence   Living Arrangements Spouse/significant other   Available Help at Discharge Family     Prior Function   Level of [place] Retired  But works part time Engineer, manufacturing systems    Leisure She walks 45 minutes or does 30 min on elliptical 4-5x/wk     Cognition   Overall Cognitive Status Within Functional Limits for tasks assessed     Posture/Postural Control   Posture/Postural Control No significant limitations     ROM / Strength   AROM / PROM / Strength AROM;Strength     AROM   AROM Assessment Site Shoulder;Cervical   Right/Left Shoulder Right;Left   Right Shoulder Extension 56 Degrees   Right Shoulder Flexion 151 Degrees   Right Shoulder ABduction 150 Degrees   Right Shoulder Internal Rotation 66 Degrees   Right Shoulder External Rotation 88 Degrees   Left Shoulder Extension 50 Degrees   Left Shoulder Flexion 139 Degrees   Left Shoulder ABduction 150 Degrees  Left Shoulder Internal Rotation 60 Degrees   Left Shoulder External Rotation 90 Degrees   Cervical Flexion WNL   Cervical Extension WNL   Cervical - Right Side Bend WNL   Cervical - Left Side Bend WNL   Cervical - Right Rotation WNL   Cervical - Left Rotation WNL     Strength   Overall Strength Within functional limits for tasks performed           LYMPHEDEMA/ONCOLOGY QUESTIONNAIRE - 03/02/16 1524      Type   Cancer Type right breast cancer     Lymphedema Assessments   Lymphedema Assessments Upper extremities     Right Upper Extremity Lymphedema   10 cm Proximal to Olecranon Process 26.7 cm   Olecranon Process 22.4 cm   10 cm Proximal to Ulnar Styloid Process 20.5 cm   Just Proximal to Ulnar Styloid Process 14.5 cm   Across Hand at [employer] 17.4 cm   At [place] of 2nd Digit 6.3 cm     Left Upper Extremity Lymphedema   10 cm Proximal to Olecranon Process 26.4 cm   Olecranon Process 22.5 cm   10 cm Proximal to Ulnar Styloid Process 19.8 cm   Just Proximal to Ulnar Styloid Process 13.9 cm   Across Hand at [employer] 16.6 cm   At [place] of 2nd Digit 5.5 cm       Patient was instructed today in a home exercise program today for post op shoulder range of motion. These included active assist shoulder flexion in sitting, scapular retraction, wall walking with shoulder abduction, and hands behind head external rotation.  She was encouraged to do these twice a day, holding 3 seconds and repeating 5 times when permitted by her physician.         PT Education - 03/02/16 1525    Education provided Yes   Education Details Lymphedema risk reduction and post op shoulder ROM HEP   Person(s) Educated Patient;Spouse   Methods Explanation;Demonstration;Handout   Comprehension Returned demonstration;Verbalized understanding              Breast Clinic Goals - 03/02/16 1529      Patient will be able to verbalize understanding of pertinent lymphedema risk reduction practices relevant to her diagnosis specifically related to skin care.   Time 1   Period Days   Status Achieved     Patient will be able to return demonstrate and/or verbalize understanding of the post-op home exercise program related to regaining shoulder range of motion.   Time 1   Period Days   Status Achieved     Patient will be able to verbalize understanding of the importance of attending the postoperative After Breast Cancer Class for further lymphedema risk reduction education and therapeutic exercise.   Time 1   Period Days   Status Achieved              Plan - 03/02/16 1527    Clinical Impression Statement Patient was diagnosed on 02/11/16 with right grade 1 invasive ductal carcinoma breast cancer.  It is ER positive, PR negative, and HER2  negative.  It is located in the upper outer quadrant and measures 9 mm with a Ki67 of 2%. Her multidisciplinary medical team met prior to her assessments to determine a recommended treatment plan.  She is planning to have a right lumpectomy and sentinel node biopsy followed by radiation and anti-estrogen therapy.  She may benefit from post op PT to  regain shoulder ROM and reduce lymphedema risk.  Due to her lack of comorbidities, her eval is of low complexity.   Rehab Potential Excellent   Clinical Impairments Affecting Rehab Potential none   PT Frequency One time visit   PT Treatment/Interventions Patient/family education;Therapeutic exercise   PT Next Visit Plan Will f/u as needed after surgery to determine PT needs   PT Home Exercise Plan Post op shoulder ROM HEP   Consulted and Agree with Plan of Care Patient;Family member/caregiver   Family Member Consulted husband      Patient will benefit from skilled therapeutic intervention in order to improve the following deficits and impairments:  Pain, Decreased knowledge of precautions, Impaired UE functional use, Decreased range of motion  Visit Diagnosis: Carcinoma of upper-outer quadrant of female breast, right ([place]) - Plan: PT plan of care cert/re-cert      G-Codes - 00/51/10 1530    Functional Assessment Tool Used Clinical Judgement   Functional Limitation Other PT primary   Other PT Primary Current Status (Y1117) At least 1 percent but less than 20 percent impaired, limited or restricted   Other PT Primary Goal Status (B5670) At least 1 percent but less than 20 percent impaired, limited or restricted   Other PT Primary Discharge Status (L4103) At least 1 percent but less than 20 percent impaired, limited or restricted     Patient will follow up at outpatient cancer rehab if needed following surgery.  If the patient requires physical therapy at that time, a specific plan will be dictated and sent to the referring physician for  approval. The patient was educated today on appropriate basic range of motion exercises to begin post operatively and the importance of attending the After Breast Cancer class following surgery.  Patient was educated today on lymphedema risk reduction practices as it pertains to recommendations that will benefit the patient immediately following surgery.  She verbalized good understanding.  No additional physical therapy is indicated at this time.     Problem List Patient Active Problem List   Diagnosis Date Noted  . Breast cancer of upper-inner quadrant of right female breast ([place]) 02/25/2016    Annia Friendly, PT 03/02/16 3:31 PM  [place], [place], [address] Phone: [phone]   Fax:  [phone]  Name: Robyn Hobbs MRN: [id number] Date of Birth: [birth year]

## 2016-03-03 ENCOUNTER — Encounter: Payer: Self-pay | Admitting: General Practice

## 2016-03-03 NOTE — Progress Notes (Signed)
[place] Psychosocial Distress Screening Spiritual Care  Met with Luetta and husband Mikki Santee in [facility] to introduce [place] team/resources, reviewing distress screen per protocol.  The patient scored a 3 on the Psychosocial Distress Thermometer which indicates mild distress. Also assessed for distress and other psychosocial needs.   ONCBCN DISTRESS SCREENING 03/03/2016  Screening Type Initial Screening  Distress experienced in past week (1-10) 3  Information Concerns Type Lack of info about diagnosis;Lack of info about treatment  Referral to support programs Yes  Other [place] reports good support from family and friends.  Per pt, information concerns have been resolved through [facility]. Referred her question about whether genetic testing would be applicable/beneficial to navigator [facility].  Follow up needed: No.  Pt/family aware of ongoing [place] team/programming availability and have full resource packet, but please also page if needs arise/circumstances change.  Thank you.  [place], [place], [facility] Pager [phone] Voicemail [phone]

## 2016-03-04 ENCOUNTER — Other Ambulatory Visit: Payer: Self-pay | Admitting: General Surgery

## 2016-03-04 DIAGNOSIS — C50411 Malignant neoplasm of upper-outer quadrant of right female breast: Secondary | ICD-10-CM

## 2016-03-08 ENCOUNTER — Telehealth: Payer: Self-pay | Admitting: *Deleted

## 2016-03-08 NOTE — Telephone Encounter (Signed)
  Oncology Nurse Navigator Documentation  Navigator Location: [facility] On[facility] (03/08/16 1600) Navigator Encounter Type: Telephone (03/08/16 1600) Telephone: Outgoing Call;Clinic/[facility] Follow-up (03/08/16 1600)             Barriers/Navigation Needs: No barriers at this time (03/08/16 1600)                          Time Spent with Patient: 15 (03/08/16 1600)    

## 2016-03-14 ENCOUNTER — Encounter (HOSPITAL_BASED_OUTPATIENT_CLINIC_OR_DEPARTMENT_OTHER): Payer: Self-pay | Admitting: *Deleted

## 2016-03-14 NOTE — Progress Notes (Signed)
Coming Wednesday for Boost and instructions.

## 2016-03-16 ENCOUNTER — Ambulatory Visit
Admission: RE | Admit: 2016-03-16 | Discharge: 2016-03-16 | Disposition: A | Discharge status: Home / Self Care | Payer: Medicare HMO | Source: Ambulatory Visit | Attending: General Surgery | Admitting: General Surgery

## 2016-03-16 DIAGNOSIS — C50411 Malignant neoplasm of upper-outer quadrant of right female breast: Secondary | ICD-10-CM

## 2016-03-16 NOTE — Progress Notes (Signed)
Pt given 8 oz carton of boost breeze with written and verbal instructions to drink by 630 am morning of surgery /. Drink no other liquids after midnight pt voiced understanding  Teach back used

## 2016-03-17 ENCOUNTER — Ambulatory Visit (HOSPITAL_BASED_OUTPATIENT_CLINIC_OR_DEPARTMENT_OTHER)
Admission: RE | Admit: 2016-03-17 | Discharge: 2016-03-17 | Disposition: A | Discharge status: Home / Self Care | Payer: Medicare HMO | Source: Ambulatory Visit | Attending: General Surgery | Admitting: General Surgery

## 2016-03-17 ENCOUNTER — Ambulatory Visit (HOSPITAL_COMMUNITY)
Admission: RE | Admit: 2016-03-17 | Discharge: 2016-03-17 | Disposition: A | Discharge status: Home / Self Care | Payer: Medicare HMO | Source: Ambulatory Visit | Attending: General Surgery | Admitting: General Surgery

## 2016-03-17 ENCOUNTER — Encounter (HOSPITAL_BASED_OUTPATIENT_CLINIC_OR_DEPARTMENT_OTHER): Payer: Self-pay | Admitting: *Deleted

## 2016-03-17 ENCOUNTER — Encounter (HOSPITAL_BASED_OUTPATIENT_CLINIC_OR_DEPARTMENT_OTHER)
Admission: RE | Disposition: A | Discharge status: Home / Self Care | Payer: Self-pay | Source: Ambulatory Visit | Attending: General Surgery

## 2016-03-17 ENCOUNTER — Ambulatory Visit
Admission: RE | Admit: 2016-03-17 | Discharge: 2016-03-17 | Disposition: A | Discharge status: Home / Self Care | Payer: Medicare HMO | Source: Ambulatory Visit | Attending: General Surgery | Admitting: General Surgery

## 2016-03-17 ENCOUNTER — Ambulatory Visit (HOSPITAL_BASED_OUTPATIENT_CLINIC_OR_DEPARTMENT_OTHER): Payer: Medicare HMO | Admitting: Anesthesiology

## 2016-03-17 DIAGNOSIS — D0511 Intraductal carcinoma in situ of right breast: Secondary | ICD-10-CM | POA: Insufficient documentation

## 2016-03-17 DIAGNOSIS — C50411 Malignant neoplasm of upper-outer quadrant of right female breast: Secondary | ICD-10-CM

## 2016-03-17 DIAGNOSIS — C50911 Malignant neoplasm of unspecified site of right female breast: Secondary | ICD-10-CM | POA: Diagnosis present

## 2016-03-17 DIAGNOSIS — Z170 Estrogen receptor positive status [ER+]: Secondary | ICD-10-CM | POA: Insufficient documentation

## 2016-03-17 DIAGNOSIS — Z79899 Other long term (current) drug therapy: Secondary | ICD-10-CM | POA: Insufficient documentation

## 2016-03-17 HISTORY — PX: BREAST LUMPECTOMY WITH RADIOACTIVE SEED AND SENTINEL LYMPH NODE BIOPSY: SHX6550

## 2016-03-17 SURGERY — BREAST LUMPECTOMY WITH RADIOACTIVE SEED AND SENTINEL LYMPH NODE BIOPSY
Anesthesia: General | Site: Breast | Laterality: Right

## 2016-03-17 MED ORDER — BUPIVACAINE HCL (PF) 0.25 % IJ SOLN
INTRAMUSCULAR | Status: DC | PRN
  Administered 2016-03-17: 17 mL

## 2016-03-17 MED ORDER — CHLORHEXIDINE GLUCONATE CLOTH 2 % EX PADS: 6.0000 | MEDICATED_PAD | Freq: Once | CUTANEOUS | Status: DC

## 2016-03-17 MED ORDER — DEXAMETHASONE SODIUM PHOSPHATE 4 MG/ML IJ SOLN
INTRAMUSCULAR | Status: DC | PRN
  Administered 2016-03-17: 10 mg via INTRAVENOUS

## 2016-03-17 MED ORDER — LACTATED RINGERS IV SOLN
INTRAVENOUS | Status: DC
  Administered 2016-03-17 (×2): via INTRAVENOUS

## 2016-03-17 MED ORDER — FENTANYL CITRATE (PF) 100 MCG/2ML IJ SOLN
50.0000 ug | INTRAMUSCULAR | Status: DC | PRN
  Administered 2016-03-17: 100 ug via INTRAVENOUS
  Administered 2016-03-17: 50 ug via INTRAVENOUS

## 2016-03-17 MED ORDER — LIDOCAINE 2% (20 MG/ML) 5 ML SYRINGE
INTRAMUSCULAR | Status: DC | PRN
  Administered 2016-03-17: 50 mg via INTRAVENOUS

## 2016-03-17 MED ORDER — FENTANYL CITRATE (PF) 100 MCG/2ML IJ SOLN
INTRAMUSCULAR | Status: AC
  Filled 2016-03-17: qty 2

## 2016-03-17 MED ORDER — HYDROCODONE-ACETAMINOPHEN 5-325 MG PO TABS: 1.0000 | ORAL_TABLET | Freq: Four times a day (QID) | ORAL | 0 refills | Status: DC | PRN

## 2016-03-17 MED ORDER — ONDANSETRON HCL 4 MG/2ML IJ SOLN
INTRAMUSCULAR | Status: DC | PRN
  Administered 2016-03-17: 4 mg via INTRAVENOUS

## 2016-03-17 MED ORDER — FENTANYL CITRATE (PF) 100 MCG/2ML IJ SOLN: 25.0000 ug | INTRAMUSCULAR | Status: DC | PRN

## 2016-03-17 MED ORDER — MIDAZOLAM HCL 2 MG/2ML IJ SOLN
1.0000 mg | INTRAMUSCULAR | Status: DC | PRN
  Administered 2016-03-17: 2 mg via INTRAVENOUS
  Administered 2016-03-17: 1 mg via INTRAVENOUS

## 2016-03-17 MED ORDER — MIDAZOLAM HCL 2 MG/2ML IJ SOLN
INTRAMUSCULAR | Status: AC
  Filled 2016-03-17: qty 2

## 2016-03-17 MED ORDER — CEFAZOLIN SODIUM-DEXTROSE 2-4 GM/100ML-% IV SOLN
2.0000 g | INTRAVENOUS | Status: AC
  Administered 2016-03-17: 2 g via INTRAVENOUS

## 2016-03-17 MED ORDER — MEPERIDINE HCL 25 MG/ML IJ SOLN: 6.2500 mg | INTRAMUSCULAR | Status: DC | PRN

## 2016-03-17 MED ORDER — GLYCOPYRROLATE 0.2 MG/ML IJ SOLN: 0.2000 mg | Freq: Once | INTRAMUSCULAR | Status: DC | PRN

## 2016-03-17 MED ORDER — TECHNETIUM TC 99M SULFUR COLLOID FILTERED
1.0000 | Freq: Once | INTRAVENOUS | Status: AC | PRN
  Administered 2016-03-17: 1 via INTRADERMAL

## 2016-03-17 MED ORDER — PROPOFOL 10 MG/ML IV BOLUS
INTRAVENOUS | Status: AC
  Filled 2016-03-17: qty 20

## 2016-03-17 MED ORDER — METOCLOPRAMIDE HCL 5 MG/ML IJ SOLN: 10.0000 mg | Freq: Once | INTRAMUSCULAR | Status: DC | PRN

## 2016-03-17 MED ORDER — CEFAZOLIN SODIUM-DEXTROSE 2-4 GM/100ML-% IV SOLN
INTRAVENOUS | Status: AC
  Filled 2016-03-17: qty 100

## 2016-03-17 MED ORDER — SCOPOLAMINE 1 MG/3DAYS TD PT72: 1.0000 | MEDICATED_PATCH | Freq: Once | TRANSDERMAL | Status: DC | PRN

## 2016-03-17 MED ORDER — BUPIVACAINE-EPINEPHRINE (PF) 0.5% -1:200000 IJ SOLN
INTRAMUSCULAR | Status: DC | PRN
  Administered 2016-03-17: 30 mL via PERINEURAL

## 2016-03-17 MED ORDER — PROPOFOL 10 MG/ML IV BOLUS
INTRAVENOUS | Status: DC | PRN
  Administered 2016-03-17: 150 mg via INTRAVENOUS

## 2016-03-17 SURGICAL SUPPLY — 40 items
APPLIER CLIP 9.375 MED OPEN (MISCELLANEOUS) ×2
BLADE SURG 15 STRL LF DISP TIS (BLADE) ×1 IMPLANT
BLADE SURG 15 STRL SS (BLADE) ×1
CANISTER SUC SOCK COL 7IN (MISCELLANEOUS) IMPLANT
CANISTER SUCT 1200ML W/VALVE (MISCELLANEOUS) IMPLANT
CHLORAPREP W/TINT 26ML (MISCELLANEOUS) ×2 IMPLANT
CLIP APPLIE 9.375 MED OPEN (MISCELLANEOUS) ×1 IMPLANT
COVER BACK TABLE 60X90IN (DRAPES) ×2 IMPLANT
COVER MAYO STAND STRL (DRAPES) ×2 IMPLANT
COVER PROBE W GEL 5X96 (DRAPES) ×2 IMPLANT
DECANTER SPIKE VIAL GLASS SM (MISCELLANEOUS) IMPLANT
DEVICE DUBIN W/COMP PLATE 8390 (MISCELLANEOUS) ×2 IMPLANT
DRAPE LAPAROSCOPIC ABDOMINAL (DRAPES) ×2 IMPLANT
DRAPE UTILITY XL STRL (DRAPES) ×2 IMPLANT
ELECT COATED BLADE 2.86 ST (ELECTRODE) ×2 IMPLANT
ELECT REM PT RETURN 9FT ADLT (ELECTROSURGICAL) ×2
ELECTRODE REM PT RTRN 9FT ADLT (ELECTROSURGICAL) ×1 IMPLANT
GLOVE BIO SURGEON STRL SZ7.5 (GLOVE) ×2 IMPLANT
GOWN STRL REUS W/ TWL LRG LVL3 (GOWN DISPOSABLE) ×2 IMPLANT
GOWN STRL REUS W/TWL LRG LVL3 (GOWN DISPOSABLE) ×2
ILLUMINATOR WAVEGUIDE N/F (MISCELLANEOUS) IMPLANT
KIT MARKER MARGIN INK (KITS) ×2 IMPLANT
LIGHT WAVEGUIDE WIDE FLAT (MISCELLANEOUS) IMPLANT
LIQUID BAND (GAUZE/BANDAGES/DRESSINGS) ×4 IMPLANT
NDL SAFETY ECLIPSE 18X1.5 (NEEDLE) IMPLANT
NEEDLE HYPO 18GX1.5 SHARP (NEEDLE)
NEEDLE HYPO 25X1 1.5 SAFETY (NEEDLE) ×2 IMPLANT
NS IRRIG 1000ML POUR BTL (IV SOLUTION) IMPLANT
PACK BASIN DAY SURGERY FS (CUSTOM PROCEDURE TRAY) ×2 IMPLANT
PENCIL BUTTON HOLSTER BLD 10FT (ELECTRODE) ×2 IMPLANT
SLEEVE SCD COMPRESS KNEE MED (MISCELLANEOUS) ×2 IMPLANT
SPONGE LAP 18X18 X RAY DECT (DISPOSABLE) ×2 IMPLANT
SUT MON AB 4-0 PC3 18 (SUTURE) ×4 IMPLANT
SUT SILK 2 0 SH (SUTURE) IMPLANT
SUT VICRYL 3-0 CR8 SH (SUTURE) ×2 IMPLANT
SYR CONTROL 10ML LL (SYRINGE) ×2 IMPLANT
TOWEL OR 17X24 6PK STRL BLUE (TOWEL DISPOSABLE) ×2 IMPLANT
TOWEL OR NON WOVEN STRL DISP B (DISPOSABLE) ×2 IMPLANT
TUBE CONNECTING 20X1/4 (TUBING) IMPLANT
YANKAUER SUCT BULB TIP NO VENT (SUCTIONS) IMPLANT

## 2016-03-17 NOTE — Transfer of Care (Signed)
Immediate Anesthesia Transfer of Care Note  Patient: Robyn Hobbs  Procedure(s) Performed: Procedure(s) with comments: BREAST LUMPECTOMY WITH RADIOACTIVE SEED AND SENTINEL LYMPH NODE BIOPSY (Right) - BREAST LUMPECTOMY WITH RADIOACTIVE SEED AND SENTINEL LYMPH NODE BIOPSY  Patient Location: PACU  Anesthesia Type:GA combined with regional for post-op pain  Level of Consciousness: awake, alert  and oriented  Airway & Oxygen Therapy: Patient Spontanous Breathing and Patient connected to face mask oxygen  Post-op Assessment: Report given to RN and Post -op Vital signs reviewed and stable  Post vital signs: Reviewed and stable  Last Vitals:  Vitals:   03/17/16 0940 03/17/16 1130  BP:    Pulse: 67   Resp: 11   Temp:  36.4 C    Last Pain:  Vitals:   03/17/16 0851  TempSrc: Oral         Complications: No apparent anesthesia complications

## 2016-03-17 NOTE — Anesthesia Preprocedure Evaluation (Signed)
Anesthesia Evaluation  Patient identified by MRN, date of birth, ID band Patient awake    Reviewed: Allergy & Precautions, NPO status , Patient's Chart, lab work & pertinent test results  Airway Mallampati: II  TM Distance: >3 FB Neck ROM: Full    Dental no notable dental hx.    Pulmonary neg pulmonary ROS,    Pulmonary exam normal breath sounds clear to auscultation       Cardiovascular negative cardio ROS Normal cardiovascular exam Rhythm:Regular Rate:Normal     Neuro/Psych negative neurological ROS  negative psych ROS   GI/Hepatic negative GI ROS, Neg liver ROS,   Endo/Other  negative endocrine ROS  Renal/GU negative Renal ROS  negative genitourinary   Musculoskeletal negative musculoskeletal ROS (+)   Abdominal   Peds negative pediatric ROS (+)  Hematology negative hematology ROS (+)   Anesthesia Other Findings   Reproductive/Obstetrics negative OB ROS                             Anesthesia Physical Anesthesia Plan  ASA: II  Anesthesia Plan: General   Post-op Pain Management: GA combined w/ Regional for post-op pain   Induction: Intravenous  Airway Management Planned: LMA  Additional Equipment:   Intra-op Plan:   Post-operative Plan: Extubation in OR  Informed Consent: I have reviewed the patients History and Physical, chart, labs and discussed the procedure including the risks, benefits and alternatives for the proposed anesthesia with the patient or authorized representative who has indicated his/her understanding and acceptance.   Dental advisory given  Plan Discussed with: CRNA  Anesthesia Plan Comments: (pec block)        Anesthesia Quick Evaluation

## 2016-03-17 NOTE — Op Note (Signed)
03/17/2016  11:33 AM  PATIENT:  Robyn Hobbs  65 y.o. female  PRE-OPERATIVE DIAGNOSIS:  RIGHT BREAST CANCER  POST-OPERATIVE DIAGNOSIS:  RIGHT BREAST CANCER  PROCEDURE:  Procedure(s) with comments: BREAST LUMPECTOMY WITH RADIOACTIVE SEED AND SENTINEL LYMPH NODE BIOPSY (Right) SURGEON:  Surgeon(s) and Role:    * Jovita Kussmaul, MD - Primary  PHYSICIAN ASSISTANT:   ASSISTANTS: none   ANESTHESIA:   general  EBL:  Total I/O In: 1000 [I.V.:1000] Out: 10 [Blood:10]  BLOOD ADMINISTERED:none  DRAINS: none   LOCAL MEDICATIONS USED:  MARCAINE     SPECIMEN:  Source of Specimen:  right breast tissue and sentinel nodes X 3  DISPOSITION OF SPECIMEN:  PATHOLOGY  COUNTS:  YES  TOURNIQUET:  * No tourniquets in log *  DICTATION: .Dragon Dictation   After informed consent was obtained the patient was brought to the operating room and placed in the supine position on the operating room table. After adequate induction of general anesthesia the patient's right chest, breast, and axillary area were prepped with ChloraPrep, allowed to dry, and draped in usual sterile manner. An appropriate timeout was performed. Earlier in the day the patient underwent injection of 1 mCi of technetium sulfur colloid in the subareolar position on the right.Previously an I-125 seed was placed in the upper inner quadrant of the right breast to mark an area of invasive breast cancer. The neoprobe was initially set to technetium. The right axilla was examined and there was a hot spot identified.  The area was infiltrated with quarter percent Marcaine. A small transversely oriented incision was made overlying the hot spot with a 15 blade knife. The incision was carried through the skin and subcutaneous drainage tissue sharply with electrocautery until the axilla was entered. The neoprobe was used to direct blunt hemostat dissection until a hot lymph node was identified. This was excised sharply with the electrocautery and  the lymphatics were controlled with clips.2 other small palpable lymph nodes were also identified and these were also excised sharply with electrocautery and the lymphatics were controlled with clips. Ex vivo counts on sentinel node #1 was approximately 50.  There were no other hot or palpable lymph nodes identified in the right axilla. The deep layer of the incision was then closed with interrupted 3-0 Vicryl stitches. The skin was then closed with a running 4-0 Monocryl subcuticular stitch. Attention was then turned to the right breast. The neoprobe was switched to I-125. The area of radioactivity was readily identified in the upper portion of the right breast. An elliptical incision was made in the skin overlying the area of radioactivity. The incision was carried through the skin and subcutaneous drainage tissue sharply with the electrocautery. While checking the area of radioactivity frequently with the neoprobe a circular portion of breast tissue was excised sharply around the radioactive seed. The dissection was carried all the way to the chest wall. Once the specimen was removed it was oriented with the appropriate paint colors. A specimen radiograph was obtained that showed the clip and seed to be in the center of the specimen. The specimen was then sent to pathology for further evaluation. Hemostasis was achieved using the Bovie electrocautery. The cavity was marked with clips. The wound was irrigated with saline and infiltrated with quarter percent Marcaine.  The deep layer of the wound was then closed with layers of interrupted 3-0 Vicryl stitches. The skin was then closed with interrupted 4-0 Monocryl subcuticular stitches. Dermabond dressings were applied. The patient  tolerated the procedure well. At the end of the case all needle sponge and instrument counts were correct. The patient was then awakened and taken to recovery in stable condition.  PLAN OF CARE: Discharge to home after PACU  PATIENT  DISPOSITION:  PACU - hemodynamically stable.   Delay start of Pharmacological VTE agent (>24hrs) due to surgical blood loss or risk of bleeding: not applicable

## 2016-03-17 NOTE — Anesthesia Postprocedure Evaluation (Signed)
Anesthesia Post Note  Patient: Robyn Hobbs  Procedure(s) Performed: Procedure(s) (LRB): BREAST LUMPECTOMY WITH RADIOACTIVE SEED AND SENTINEL LYMPH NODE BIOPSY (Right)  Patient location during evaluation: PACU Anesthesia Type: General Level of consciousness: awake and alert Pain management: pain level controlled Vital Signs Assessment: post-procedure vital signs reviewed and stable Respiratory status: spontaneous breathing, nonlabored ventilation, respiratory function stable and patient connected to nasal cannula oxygen Cardiovascular status: blood pressure returned to baseline and stable Postop Assessment: no signs of nausea or vomiting Anesthetic complications: no    Last Vitals:  Vitals:   03/17/16 1245 03/17/16 1256  BP: 138/75   Pulse: (!) 59 65  Resp: 10 16  Temp:      Last Pain:  Vitals:   03/17/16 1245  TempSrc:   PainSc: 1                  Montez Hageman

## 2016-03-17 NOTE — Discharge Instructions (Addendum)

## 2016-03-17 NOTE — Anesthesia Procedure Notes (Signed)
Procedure Name: LMA Insertion Date/Time: 03/17/2016 10:35 AM Performed by: Melynda Ripple D Pre-anesthesia Checklist: Patient identified, Emergency Drugs available, Suction available and Patient being monitored Patient Re-evaluated:Patient Re-evaluated prior to inductionOxygen Delivery Method: Circle system utilized Preoxygenation: Pre-oxygenation with 100% oxygen Intubation Type: IV induction Ventilation: Mask ventilation without difficulty LMA: LMA inserted LMA Size: 3.0 Number of attempts: 1 Airway Equipment and Method: Bite block Placement Confirmation: positive ETCO2 Tube secured with: Tape Dental Injury: Teeth and Oropharynx as per pre-operative assessment

## 2016-03-17 NOTE — Progress Notes (Signed)
Assisted Dr. Marcell Barlow with right ultrasound guided pectoralis block. Side rails up, monitors on throughout procedure. See vital signs in flow sheet. Tolerated Procedure well.

## 2016-03-17 NOTE — Interval H&P Note (Signed)
History and Physical Interval Note:  03/17/2016 9:52 AM  Robyn Hobbs  has presented today for surgery, with the diagnosis of RIGHT BREAST CANCER  The various methods of treatment have been discussed with the patient and family. After consideration of risks, benefits and other options for treatment, the patient has consented to  Procedure(s): BREAST LUMPECTOMY WITH RADIOACTIVE SEED AND SENTINEL LYMPH NODE BIOPSY (Right) as a surgical intervention .  The patient's history has been reviewed, patient examined, no change in status, stable for surgery.  I have reviewed the patient's chart and labs.  Questions were answered to the patient's satisfaction.     TOTH III,Ashan Cueva S

## 2016-03-17 NOTE — Anesthesia Procedure Notes (Signed)
Anesthesia Regional Block:  Pectoralis block  Pre-Anesthetic Checklist: ,, timeout performed, Correct Patient, Correct Site, Correct Laterality, Correct Procedure, Correct Position, site marked, Risks and benefits discussed,  Surgical consent,  Pre-op evaluation,  At surgeon's request and post-op pain management  Laterality: Right  Prep: Maximum Sterile Barrier Precautions used, chloraprep       Needles:  Injection technique: Single-shot  Needle Type: Echogenic Stimulator Needle     Needle Length: 10cm 10 cm Needle Gauge: 21 G    Additional Needles:  Procedures: ultrasound guided (picture in chart) Pectoralis block Narrative:  Injection made incrementally with aspirations every 5 mL.  Performed by: Personally   Additional Notes: Risks, benefits and alternative to block explained extensively.  Patient tolerated procedure well, without complications.

## 2016-03-17 NOTE — H&P (Signed)
Robyn Hobbs. [place]  Location: [facility] Patient #: [id number] DOB: [birth year] Undefined / Language: Robyn Hobbs / Race: White Female   History of Present Illness  The patient is a 65 year old female who presents with breast cancer. We are asked to see the patient in consultation by Dr. Isidore Moos to evaluate her for a new right breast cancer. The patient is a 65 year old white female who recently went for a routine screening mammogram. At that time she was found to have a mass in the 12 o clock position of the right breast. it measured 34m by u/s. 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 She was ER + and PR - and Her2 - with a Ki67 of 2%. She denies any breast pain or discharge from the nipple. She does not take any hormone replacement. 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 Hysterectomy (not due to cancer) - Complete Tonsillectomy  Diagnostic Studies History  Colonoscopy 1-5 years ago Mammogram within last year Pap Smear 1-5 years ago  Medication History  No Current Medications Medications Reconciled  Social History  Alcohol use Occasional alcohol use. Caffeine use Coffee, Tea. No drug use Tobacco use Never smoker.  Family History Arthritis Mother. Cancer Father. Colon Cancer Family Members In General. Hypertension Father. Kidney Disease Father. Prostate Cancer Brother. Respiratory Condition Family Members In General.  Pregnancy / Birth History  Age at menarche 89years. Age of menopause 51-55 Contraceptive History Oral contraceptives. Gravida 3 Length (months) of breastfeeding 7-12 Maternal age 67-25Para 3    Review of Systems General Not Present- Appetite Loss, Chills, Fatigue, Fever, Night Sweats, Weight Gain and Weight Loss. HEENT Present- Hearing Loss and Wears glasses/contact lenses.  Not Present- Earache, Hoarseness, Nose Bleed, Oral Ulcers, Ringing in the Ears, Seasonal Allergies, Sinus Pain, Sore Throat, Visual Disturbances and Yellow Eyes. Respiratory Not Present- Bloody sputum, Chronic Cough, Difficulty Breathing, Snoring and Wheezing. Breast Present- Breast Mass. Not Present- Breast Pain, Nipple Discharge and Skin Changes. Cardiovascular Not Present- Chest Pain, Difficulty Breathing Lying Down, Leg Cramps, Palpitations, Rapid Heart Rate, Shortness of Breath and Swelling of Extremities. Gastrointestinal Not Present- Abdominal Pain, Bloating, Bloody Stool, Change in Bowel Habits, Chronic diarrhea, Constipation, Difficulty Swallowing, Excessive gas, Gets full quickly at meals, Hemorrhoids, Indigestion, Nausea, Rectal Pain and Vomiting. Female Genitourinary Not Present- Frequency, Nocturia, Painful Urination, Pelvic Pain and Urgency. Musculoskeletal Not Present- Back Pain, Joint Pain, Joint Stiffness, Muscle Pain, Muscle Weakness and Swelling of Extremities. Neurological Not Present- Decreased Memory, Fainting, Headaches, Numbness, Seizures, Tingling, Tremor, Trouble walking and Weakness. Psychiatric Not Present- Anxiety, Bipolar, Change in Sleep Pattern, Depression, Fearful and Frequent crying. Endocrine Not Present- Cold Intolerance, Excessive Hunger, Hair Changes, Heat Intolerance, Hot flashes and New Diabetes. Hematology Not Present- Blood Thinners, Easy Bruising, Excessive bleeding, Gland problems, HIV and Persistent Infections.   Physical Exam  General Mental Status-Alert. General Appearance-Consistent with stated age. Hydration-Well hydrated. Voice-Normal.  Head and Neck Head-normocephalic, atraumatic with no lesions or palpable masses. Trachea-midline. Thyroid Gland Characteristics - normal size and consistency.  Eye Eyeball - Bilateral-Extraocular movements intact. Sclera/Conjunctiva - Bilateral-No scleral icterus.  Chest and Lung  Exam Chest and lung exam reveals -quiet, even and easy respiratory effort with no use of accessory muscles and on auscultation, normal breath sounds, no adventitious sounds and normal vocal resonance. Inspection Chest Wall - Normal.  Back - normal.  Breast Note: There is no palpable mass in either breast. There is no palpable axillary, supraclavicular, or cervical lymphadenopathy   Cardiovascular Cardiovascular examination reveals -normal heart sounds, regular rate and rhythm with no murmurs and normal pedal pulses bilaterally.  Abdomen Inspection Inspection of the abdomen reveals - No Hernias. Skin - Scar - no surgical scars. Palpation/Percussion Palpation and Percussion of the abdomen reveal - Soft, Non Tender, No Rebound tenderness, No Rigidity (guarding) and No hepatosplenomegaly. Auscultation Auscultation of the abdomen reveals - Bowel sounds normal.  Neurologic Neurologic evaluation reveals -alert and oriented x 3 with no impairment of recent or remote memory. Mental Status-Normal.  Musculoskeletal Normal Exam - Left-Upper Extremity Strength Normal and Lower Extremity Strength Normal. Normal Exam - Right-Upper Extremity Strength Normal and Lower Extremity Strength Normal.  Lymphatic Head & Neck  General Head & Neck Lymphatics: Bilateral - Description - Normal. Axillary  General Axillary Region: Bilateral - Description - Normal. Tenderness - Non Tender. Femoral & Inguinal  Generalized Femoral & Inguinal Lymphatics: Bilateral - Description - Normal. Tenderness - Non Tender.    Assessment & Plan BREAST CANCER OF UPPER-OUTER QUADRANT OF RIGHT FEMALE BREAST (C50.411) Impression: The patient appears to have a small stage I cancer in the upper portion of the right breast. I have talked to her in detail about the different options for treatment and at this point she favors breast conservation. she is also a good candidate for sentinel node mapping. I have  discussed with her in detail the risks and benefits of the surgery as well as some of the technical aspects and she understands and wishes to proceed. I will plan for a right breast radioactive seed localized lumpectomy and sentinel node mapping. Current Plans Pt Education - Breast cancer: discussed with patient and provided information.

## 2016-03-18 ENCOUNTER — Encounter (HOSPITAL_BASED_OUTPATIENT_CLINIC_OR_DEPARTMENT_OTHER): Payer: Self-pay | Admitting: General Surgery

## 2016-03-22 ENCOUNTER — Telehealth: Payer: Self-pay | Admitting: *Deleted

## 2016-03-22 NOTE — Telephone Encounter (Signed)
Ordered oncotype per Dr. Jana Hakim.  Faxed requisition to pathology and confirmed receipts with Tammy.

## 2016-04-01 ENCOUNTER — Telehealth: Payer: Self-pay | Admitting: *Deleted

## 2016-04-01 NOTE — Telephone Encounter (Signed)
Received Oncotype Score of 20/13% Called pt to discuss results. Left vm for return call. Per Dr. Jana Hakim pt to be seen on 9/22 for discussion and further treatment.

## 2016-04-15 ENCOUNTER — Telehealth: Payer: Self-pay | Admitting: Oncology

## 2016-04-15 ENCOUNTER — Ambulatory Visit (HOSPITAL_BASED_OUTPATIENT_CLINIC_OR_DEPARTMENT_OTHER): Payer: Medicare HMO | Admitting: Oncology

## 2016-04-15 VITALS — BP 117/66 | HR 77 | Temp 98.4°F | Resp 18 | Wt 128.2 lb

## 2016-04-15 DIAGNOSIS — C50211 Malignant neoplasm of upper-inner quadrant of right female breast: Secondary | ICD-10-CM

## 2016-04-15 DIAGNOSIS — Z170 Estrogen receptor positive status [ER+]: Secondary | ICD-10-CM

## 2016-04-15 NOTE — Telephone Encounter (Signed)
Gave patient avs report and appointments for novembe. Patient also given 10/13 appointment with Dr. Isidore Moos 10/13 - spoke with Suanne Marker. Next available 10/4 and patient out of town week of 10/4.

## 2016-04-15 NOTE — Progress Notes (Signed)
Robyn Hobbs  [phone] Fax:([phone]     ID: Robyn Hobbs DOB: [birth year]  MR#: [id number]  YJE#:[id number]  Patient Care Team: Karlene Einstein, MD as PCP - General (Family Medicine) Autumn Messing III, MD as Consulting Physician (General Surgery) Chauncey Cruel, MD as Consulting Physician (Oncology) Eppie Gibson, MD as Attending Physician (Radiation Oncology) Luellen Pucker, MD (Obstetrics and Gynecology) Amy Martinique, MD as Consulting Physician (Dermatology) OTHER MD:  CHIEF COMPLAINT: Estrogen receptor positive breast cancer  CURRENT TREATMENT: Adjuvant radiation pending  BREAST CANCER HISTORY: From the original intake note:  Shuntia had screening mammography suggesting a possible mass in the right breast and [facility] diagnostic mammography with tomography and right breast ultrasonography was performed 02/17/2016. The breast density was category C. There was set up mass in the posterior third of the upper central right breast which was not palpable by exam. Ultrasonography confirmed an irregular hypoechoic mass measuring approximately 0.9 cm. Survey of the right axilla was unremarkable.  Biopsy of the right breast mass in question 02/19/2016 showed ([id number]) an invasive ductal carcinoma, grade 1, estrogen receptor 100% positive, with strong staining intensity, progesterone receptor negative, with an MIB-1 of 2%, and no HER-2 amplification, the signals ratio being 1.00 and the number per cell 1.05.  Her subsequent history is as detailed below.  INTERVAL HISTORY: Robyn Hobbs returns today for follow-up of her estrogen receptor positive breast cancer. Since her last visit here she underwent right lumpectomy and sentinel lymph node sampling, on 03/17/2016. The final pathology ([id number]) showed an invasive ductal carcinoma measuring 1.2 cm, grade 1, with negative margins. All 3 sentinel lymph nodes were clear.  An  Oncotype DX was obtained from this tumor. His score of 20 predicts a 10 year risk of recurrence outside the breast of 13% if the patient's only systemic treatment is tamoxifen for 5 years. She is here today to discuss those results.   REVIEW OF SYSTEMS: Robyn Hobbs did well with her surgery, without significant problems with pain, fever, or bleeding. She is trying to get back into her earlier exercise schedule. A detailed review of systems today was otherwise entirely stable    PAST MEDICAL HISTORY: Past Medical History:  Diagnosis Date  . Breast cancer of upper-inner quadrant of right female breast ([place]) 02/25/2016    PAST SURGICAL HISTORY: Past Surgical History:  Procedure Laterality Date  . BREAST LUMPECTOMY WITH RADIOACTIVE SEED AND SENTINEL LYMPH NODE BIOPSY Right 03/17/2016   Procedure: BREAST LUMPECTOMY WITH RADIOACTIVE SEED AND SENTINEL LYMPH NODE BIOPSY;  Surgeon: Autumn Messing III, MD;  Location: [facility];  Service: General;  Laterality: Right;  BREAST LUMPECTOMY WITH RADIOACTIVE SEED AND SENTINEL LYMPH NODE BIOPSY  . COLONOSCOPY  multiple  . Excision of Melanoma  on back    . TONSILLECTOMY     and adenoidectomy  . VAGINAL HYSTERECTOMY  2001    FAMILY HISTORY Family History  Problem Relation Age of Onset  . Kidney disease Father   . Colon cancer Maternal Grandmother   . Colon cancer Paternal Grandfather   . Colon cancer Paternal Grandmother   The patient's father died at the age of 65 from kidney cancer. The patient's mother died at the age of 38. The patient had one brother, no sisters. On the mother's side both the grandmother and grandfather had colon cancer diagnosed in their 25s. The patient's brother had prostate cancer at age 8. The patient's paternal grandfather  was diagnosed with lung cancer in his late 81s. There is no history of breast or ovarian cancer in the family  GYNECOLOGIC HISTORY:  No LMP recorded. Patient is postmenopausal. Menarche age 33, first  live birth age 37, the patient is [place] P3. She underwent fall abdominal hysterectomy with bilateral salpingo-oophorectomy June 2001. She took hormone replacement for less than a year, stopping in 2002. She used oral contraceptives for approximately 5 years remotely without complications.  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 Her husband Robyn Hobbs") used to work in [employer] but is now retired. Son Robyn Hobbs lives in [place] and is a Hydrologist for [employer], son Robyn Hobbs lives in [place] and is a serious of for Multimedia programmer, Robyn Hobbs Lives in [place] Where He Works in [employer]. The Patient Has 6 Grandchildren. She Attends a [employer].    ADVANCED DIRECTIVES: In place   HEALTH MAINTENANCE: Social History  Substance Use Topics  . Smoking status: Never Smoker  . Smokeless tobacco: Never Used  . Alcohol use 1.8 oz/week    3 Glasses of wine per week     Colonoscopy: 2014/lobe our  PAP:  Bone density: July 2017   Allergies  Allergen Reactions  . Morphine And Related Swelling    Current Outpatient Prescriptions  Medication Sig Dispense Refill  . beta carotene w/minerals (OCUVITE) tablet Take 1 tablet by mouth daily.    . calcium carbonate (OS-CAL) 600 MG TABS Take 600 mg by mouth 2 (two) times daily with a meal.    . cholecalciferol (VITAMIN D) 1000 UNITS tablet Take 2,000 Units by mouth daily.    Marland Kitchen HYDROcodone-acetaminophen (NORCO) 5-325 MG tablet Take 1-2 tablets by mouth every 6 (six) hours as needed. 20 tablet 0  . Multiple Vitamin (MULTIVITAMIN) tablet Take 1 tablet by mouth daily.     No current facility-administered medications for this visit.     OBJECTIVE: Middle-aged white woman In no acute distress Vitals:   04/15/16 1425  BP: 117/66  Pulse: 77  Resp: 18  Temp: 98.4 F (36.9 C)     Body mass index is 22.01 kg/m.    ECOG FS:0 - Asymptomatic  Sclerae unicteric, pupils round and  equal Oropharynx clear and moist-- no thrush or other lesions No cervical or supraclavicular adenopathy Lungs no rales or rhonchi Heart regular rate and rhythm Abd soft, nontender, positive bowel sounds MSK no focal spinal tenderness, no upper extremity lymphedema Neuro: nonfocal, well oriented, appropriate affect Breasts: The right breast is status post recent lumpectomy. The cosmetic result is excellent. The incision is healing nicely, without dehiscence, swelling, or erythema. The right axilla is benign. The left breast is unremarkable.   LAB RESULTS:  CMP     Component Value Date/Time   NA 139 03/02/2016 1238   K 4.0 03/02/2016 1238   CO2 27 03/02/2016 1238   GLUCOSE 168 (H) 03/02/2016 1238   BUN 18.8 03/02/2016 1238   CREATININE 1.0 03/02/2016 1238   CALCIUM 9.8 03/02/2016 1238   PROT 7.3 03/02/2016 1238   ALBUMIN 4.4 03/02/2016 1238   AST 20 03/02/2016 1238   ALT 26 03/02/2016 1238   ALKPHOS 62 03/02/2016 1238   BILITOT 0.57 03/02/2016 1238    INo results found for: SPEP, UPEP  Lab Results  Component Value Date   WBC 8.7 03/02/2016   NEUTROABS 6.8 (H) 03/02/2016   HGB 14.1 03/02/2016   HCT 43.4 03/02/2016   MCV 86.7  03/02/2016   PLT 218 03/02/2016      Chemistry      Component Value Date/Time   NA 139 03/02/2016 1238   K 4.0 03/02/2016 1238   CO2 27 03/02/2016 1238   BUN 18.8 03/02/2016 1238   CREATININE 1.0 03/02/2016 1238      Component Value Date/Time   CALCIUM 9.8 03/02/2016 1238   ALKPHOS 62 03/02/2016 1238   AST 20 03/02/2016 1238   ALT 26 03/02/2016 1238   BILITOT 0.57 03/02/2016 1238       No results found for: LABCA2  No components found for: LABCA125  No results for input(s): INR in the last 168 hours.  Urinalysis No results found for: COLORURINE, APPEARANCEUR, LABSPEC, PHURINE, GLUCOSEU, HGBUR, BILIRUBINUR, KETONESUR, PROTEINUR, UROBILINOGEN, NITRITE, LEUKOCYTESUR   STUDIES: No results found.  ELIGIBLE FOR AVAILABLE RESEARCH  PROTOCOL: no  ASSESSMENT: 65 y.o. Robyn Hobbs, [place] woman status post right breast upper inner quadrant biopsy 02/19/2016 for a clinical T1b N0, clinical stage IA invasive ductal carcinoma, grade 1, estrogen receptor positive, progesterone receptor and HER-2 negative, with an MIB-1 of 2.   (1) status post right lumpectomy and sentinel lymph node sampling 05/17/2016 for a pT1c pN0, stage IA invasive ductal carcinoma, grade 1, with negative margins  (2) Oncotype DX score of 20 predicts a 10 year risk of recurrence outside the breast of 13% if the patient's only systemic treatment is tamoxifen for 5 years  (a) given the marginal predicted benefit from chemotherapy, the patient opted against that  (2) adjuvant radiation pending  (3) adjuvant anti-estrogens to follow at the completion of local treatment.  PLAN: We spent a little over 30 minutes today going over her surgical results and the meaning of her Oncotype and she understands her cancer was small, nonaggressive-looking, and slow-growing. These tumors generally do not benefit significantly from chemotherapy and based on her Oncotype alone I quoted her a risk reduction in terms of distant disease of 4%.  However we discussed the fact that the Oncotype is based on all the participants in their database taking tamoxifen for 5 years. He now know that that is not optimal. 2 simple alternatives would be tamoxifen for 10 years or anastrozole for 5 years. Either of those options or other options we discussed less extensively today would further reduce the risk by another 2-3%.  Accordingly her ultimate risk of recurrence would be closer to 10% and her benefit of chemotherapy would be closer to 3%.  Robyn Hobbs was able to understand these numbers. We also discussed the possible toxicities, side effects and complications of chemotherapy which in her case would be cyclophosphamide and docetaxel 4. After all this it was very clear to her that she did not feel a 3  or 4% risk reduction was sufficient for her to wish to undergo chemotherapy. She does not feel anxious about this decision. I am comfortable with that as well, since at the [facility] we recommend chemotherapy for risk reductions of 5% or greater. Otherwise we only recommend consideration of chemotherapy, which is what we did today in line with NCCN guidelines  Next upper [place] is radiation. When she completes that she will start antiestrogen's. Since she is status post hysterectomy and took hormone replacement for many years with no complications, tamoxifen may be a particularly good choice for her at least to start with.  She will see me again late November. She knows to call for any problems that may develop before that visit.  Chauncey Cruel, MD   04/16/2016 6:01 PM Medical Oncology and Hematology [facility] [address] [place],  [address] Tel. [phone]    Fax. [phone]

## 2016-05-04 NOTE — Progress Notes (Signed)
Location of Breast Cancer: Right Breast  Histology per Pathology Report:  02/19/16 Diagnosis Breast, right, needle core biopsy, 12:30 o'clock - INVASIVE DUCTAL CARCINOMA. - DUCTAL CARCINOMA IN SITU.  Receptor Status: ER(100%), PR (NEG), Her2-neu (NEG), Ki-(2%)  03/17/16 Diagnosis 1. Breast, lumpectomy, Right - INVASIVE DUCTAL CARCINOMA, GRADE I/III, SPANNING 1.2 CM. - DUCTAL CARCINOMA IN SITU, LOW GRADE. - LOBULAR NEOPLASIA (ATYPICAL LOBULAR HYPERPLASIA). - SURGICAL RESECTION MARGINS ARE NEGATIVE FOR CARCINOMA. - SEE ONCOLOGY TABLE BELOW. 2. Lymph node, sentinel, biopsy, Right Axillary #1 - THERE IS NO EVIDENCE OF CARCINOMA IN 1 OF 1 LYMPH NODE (0/1). 3. Lymph node, sentinel, biopsy, Right Axillary #2 - THERE IS NO EVIDENCE OF CARCINOMA IN 1 OF 1 LYMPH NODE (0/1). 4. Lymph node, sentinel, biopsy, Right Axillary #3 - THERE IS NO EVIDENCE OF CARCINOMA IN 1 OF 1 LYMPH NODE (0/1).  Did patient present with symptoms or was this found on screening mammography?: It was found on a screening mammogram.   Past/Anticipated interventions by surgeon, if any: 03/17/16 PROCEDURE:  Procedure(s) with comments: BREAST LUMPECTOMY WITH RADIOACTIVE SEED AND SENTINEL LYMPH NODE BIOPSY (Right) SURGEON:  Surgeon(s) and Role:    * Autumn Messing III, MD - Primary   Past/Anticipated interventions by medical oncology, if any:  04/15/16 Dr. Jana Hakim (1) Oncotype DX score of 20 predicts a 10 year risk of recurrence outside the breast of 13% if the patient's only systemic treatment is tamoxifen for 5 years             (a) given the marginal predicted benefit from chemotherapy, the patient opted against that (2) adjuvant radiation pending (3) adjuvant anti-estrogens to follow at the completion of local treatment.  Lymphedema issues, if any:  She denies. She has good arm mobility. She does have numbness to the posterior side of her upper arm.   Pain issues, if any: She denies.   SAFETY ISSUES:  Prior  radiation? No  Pacemaker/ICD? No  Possible current pregnancy? No  Is the patient on methotrexate?   Current Complaints / other details:   GYNECOLOGIC HISTORY:  No LMP recorded. Patient is postmenopausal. Menarche age 13, first live birth age 70, the patient is [place] P3.  She underwent fall abdominal hysterectomy with bilateral salpingo-oophorectomy June 2001.  She took hormone replacement for less than a year, stopping in 2002.  She used oral contraceptives for approximately 5 years remotely without complications.  BP 117/69   Pulse 68   Temp 98.3 F (36.8 C)   Ht '5\' 4"'$  (1.626 m)   Wt 125 lb (56.7 kg)   SpO2 98% Comment: room air  BMI 21.46 kg/m    Wt Readings from Last 3 Encounters:  05/06/16 125 lb (56.7 kg)  04/15/16 128 lb 3.2 oz (58.2 kg)  03/17/16 128 lb (58.1 kg)      Ane Conerly, Stephani Police, RN 05/04/2016,8:45 AM

## 2016-05-06 ENCOUNTER — Ambulatory Visit
Admission: RE | Admit: 2016-05-06 | Discharge: 2016-05-06 | Disposition: A | Discharge status: Home / Self Care | Payer: Medicare HMO | Source: Ambulatory Visit | Attending: Radiation Oncology | Admitting: Radiation Oncology

## 2016-05-06 ENCOUNTER — Encounter: Payer: Self-pay | Admitting: Radiation Oncology

## 2016-05-06 DIAGNOSIS — C50211 Malignant neoplasm of upper-inner quadrant of right female breast: Secondary | ICD-10-CM

## 2016-05-06 DIAGNOSIS — Z510 Encounter for antineoplastic radiation therapy: Secondary | ICD-10-CM | POA: Insufficient documentation

## 2016-05-06 DIAGNOSIS — Z170 Estrogen receptor positive status [ER+]: Secondary | ICD-10-CM | POA: Diagnosis not present

## 2016-05-06 HISTORY — DX: Abnormal levels of other serum enzymes: R74.8

## 2016-05-06 NOTE — Progress Notes (Signed)
Radiation Oncology         [phone]) [phone] ________________________________  Name: Robyn Hobbs MRN: [id number]  Date: 05/06/2016  DOB: July 30, 1950  Follow-Up Visit Note  Outpatient  CC: Jolene Provost, MD  Magrinat, Valentino Hue, MD  Diagnosis:      ICD-9-CM ICD-10-CM   1. Malignant neoplasm of upper-inner quadrant of right female breast, unspecified estrogen receptor status (HCC) 174.2 C50.211     Stage IA (pT1c, pN0) grade 1 invasive ductal carcinoma with DCIS (ER 100%, PR 0%, HER2 negative) of the right breast.  Narrative:  The patient returns today for follow-up. The patient was seen in breast clinic on 03/02/16.   Since consultation, she underwent a right lumpectomy and sentinel lymph node biopsy on 03/17/16 by Dr. Carolynne Edouard. This revealed a 1.2 cm tumor with low grade DCIS as well. Margins were all greater than at least 2 mm. The sentinel lymph nodes were all negative. She has met with Dr. Darnelle Catalan and due to a Oncotype score of 20 chemotherapy was elected not to be pursued.  The patient presents today to discuss the role of radiotherapy for the management of her disease.  The patient took hormone replacement for less than a year and stopped in 2002. She denies lymphedema or problems with arm mobility. She has numbness in the posterior right arm. She denies pain or prior radiotherapy. She underwent abdominal hysterectomy and BSO in 2001.  Her PCP noted acutely elevated AST ALT and Alk phos last month.  These will be re tested in a week  [place], Hep panel reportedly negative.  ALLERGIES:  is allergic to morphine and related.  Meds: Current Outpatient Prescriptions  Medication Sig Dispense Refill  . beta carotene w/minerals (OCUVITE) tablet Take 1 tablet by mouth daily.    . calcium carbonate (OS-CAL) 600 MG TABS Take 600 mg by mouth 2 (two) times daily with a meal.    . cholecalciferol (VITAMIN D) 1000 UNITS tablet Take 2,000 Units by mouth daily.    . Multiple Vitamin (MULTIVITAMIN)  tablet Take 1 tablet by mouth daily.     No current facility-administered medications for this encounter.     Physical Findings:  height is 5\' 4"  (1.626 m) and weight is 125 lb (56.7 kg). Her temperature is 98.3 F (36.8 C). Her blood pressure is 117/69 and her pulse is 68. Her oxygen saturation is 98%. .     General: Alert and oriented, in no acute distress HEENT: Head is normocephalic. Extraocular movements are intact. Oropharynx is clear. Neck: Neck is supple, no palpable cervical or supraclavicular lymphadenopathy. Heart: Regular in rate and rhythm with no murmurs, rubs, or gallops. Chest: Clear to auscultation bilaterally, with no rhonchi, wheezes, or rales. Abdomen: Soft, nontender, nondistended, with no rigidity or guarding. Extremities: No cyanosis or edema. Lymphatics: see Neck Exam Musculoskeletal: symmetric strength and muscle tone throughout. Neurologic: No obvious focalities. Speech is fluent.  Psychiatric: Judgment and insight are intact. Affect is appropriate. Breast exam reveals well healed right breast lumpectomy and right axillary scars.  Lab Findings: Lab Results  Component Value Date   WBC 8.7 03/02/2016   HGB 14.1 03/02/2016   HCT 43.4 03/02/2016   MCV 86.7 03/02/2016   PLT 218 03/02/2016   CMP     Component Value Date/Time   NA 139 03/02/2016 1238   K 4.0 03/02/2016 1238   CO2 27 03/02/2016 1238   GLUCOSE 168 (H) 03/02/2016 1238   BUN 18.8 03/02/2016 1238   CREATININE 1.0 03/02/2016  1238   CALCIUM 9.8 03/02/2016 1238   PROT 7.3 03/02/2016 1238   ALBUMIN 4.4 03/02/2016 1238   AST 20 03/02/2016 1238   ALT 26 03/02/2016 1238   ALKPHOS 62 03/02/2016 1238   BILITOT 0.57 03/02/2016 1238    Radiographic Findings: No results found.  Impression/Plan: Stage I right breast cancer  We discussed adjuvant radiotherapy today.  I recommend 3 1/2 weeks of radiotherapy to the right breast in order to reduce the risk of locoregional recurrence by 2/3rds.  The  risks, benefits and side effects of this treatment were discussed in detail.  She understands that radiotherapy is associated with skin irritation and fatigue in the acute setting. Late effects can include cosmetic changes and rare injury to internal organs. She is enthusiastic about proceeding with treatment. A consent form has been signed and placed in her chart.  Simulation next week.  I spent 30  minutes face to face with the patient and more than 50% of that time was spent in counseling and/or coordination of care. _____________________________________   Eppie Gibson, MD

## 2016-05-09 NOTE — Progress Notes (Signed)
  Radiation Oncology         [phone] ________________________________  Name: Robyn Hobbs MRN: [id number]  Date: 05/10/2016  DOB: 04/12/1951  SIMULATION AND TREATMENT PLANNING NOTE    Outpatient  DIAGNOSIS:     ICD-9-CM ICD-10-CM   1. Malignant neoplasm of upper-inner quadrant of right breast in female, estrogen receptor positive ([place]) 174.2 C50.211    V86.0 Z17.0     NARRATIVE:  The patient was brought to the [facility].  Identity was confirmed.  All relevant records and images related to the planned course of therapy were reviewed.  The patient freely provided informed written consent to proceed with treatment after reviewing the details related to the planned course of therapy. The consent form was witnessed and verified by the simulation staff.    Then, the patient was set-up in a stable reproducible supine position for radiation therapy with her ipsilateral arm over her head, and her upper body secured in a custom-made Vac-lok device.  CT images were obtained.  Surface markings were placed.  The CT images were loaded into the planning software.    TREATMENT PLANNING NOTE: Treatment planning then occurred.  The radiation prescription was entered and confirmed.     A total of 3 medically necessary complex treatment devices were fabricated and supervised by me: 2 fields with MLCs for custom blocks to protect heart, and lungs;  and, a Vac-lok. MORE COMPLEX DEVICES MAY BE MADE IN DOSIMETRY FOR FIELD IN FIELD BEAMS FOR DOSE HOMOGENEITY.  I have requested : 3D Simulation  I have requested a DVH of the following structures: lungs, heart, lumpectomy cavity.    The patient will receive 42.56 Gy in 16 fractions to the right breast with 2 tangential fields.   This will not be followed by a boost.  Optical Surface Tracking Plan:  Since intensity modulated radiotherapy (IMRT) and 3D conformal radiation treatment methods are predicated on accurate and precise positioning for  treatment, intrafraction motion monitoring is medically necessary to ensure accurate and safe treatment delivery. The ability to quantify intrafraction motion without excessive ionizing radiation dose can only be performed with optical surface tracking. Accordingly, surface imaging offers the opportunity to obtain 3D measurements of patient position throughout IMRT and 3D treatments without excessive radiation exposure. I am ordering optical surface tracking for this patient's upcoming course of radiotherapy.  ________________________________   Reference:  Ursula Alert, J, et al. Surface imaging-based analysis of intrafraction motion for breast radiotherapy patients.Journal of [place], n. 6, nov. 2014. ISSN [id number].  Available at: <http://www.jacmp.org/index.php/jacmp/article/view/4957>.    -----------------------------------  Eppie Gibson, MD

## 2016-05-10 ENCOUNTER — Ambulatory Visit
Admission: RE | Admit: 2016-05-10 | Discharge: 2016-05-10 | Disposition: A | Discharge status: Home / Self Care | Payer: Medicare HMO | Source: Ambulatory Visit | Attending: Radiation Oncology | Admitting: Radiation Oncology

## 2016-05-10 DIAGNOSIS — Z170 Estrogen receptor positive status [ER+]: Principal | ICD-10-CM

## 2016-05-10 DIAGNOSIS — Z510 Encounter for antineoplastic radiation therapy: Secondary | ICD-10-CM | POA: Diagnosis not present

## 2016-05-10 DIAGNOSIS — C50211 Malignant neoplasm of upper-inner quadrant of right female breast: Secondary | ICD-10-CM

## 2016-05-16 DIAGNOSIS — Z510 Encounter for antineoplastic radiation therapy: Secondary | ICD-10-CM | POA: Diagnosis not present

## 2016-05-17 ENCOUNTER — Ambulatory Visit
Admission: RE | Admit: 2016-05-17 | Discharge: 2016-05-17 | Disposition: A | Discharge status: Home / Self Care | Payer: Medicare HMO | Source: Ambulatory Visit | Attending: Radiation Oncology | Admitting: Radiation Oncology

## 2016-05-17 DIAGNOSIS — Z170 Estrogen receptor positive status [ER+]: Principal | ICD-10-CM

## 2016-05-17 DIAGNOSIS — C50211 Malignant neoplasm of upper-inner quadrant of right female breast: Secondary | ICD-10-CM

## 2016-05-17 DIAGNOSIS — Z510 Encounter for antineoplastic radiation therapy: Secondary | ICD-10-CM | POA: Diagnosis not present

## 2016-05-17 MED ORDER — RADIAPLEXRX EX GEL
Freq: Once | CUTANEOUS | Status: AC
  Administered 2016-05-17: 18:00:00 via TOPICAL

## 2016-05-17 MED ORDER — ALRA NON-METALLIC DEODORANT (RAD-ONC)
1.0000 "application " | Freq: Once | TOPICAL | Status: AC
  Administered 2016-05-17: 1 via TOPICAL

## 2016-05-17 NOTE — Progress Notes (Signed)

## 2016-05-18 ENCOUNTER — Ambulatory Visit
Admission: RE | Admit: 2016-05-18 | Discharge: 2016-05-18 | Disposition: A | Discharge status: Home / Self Care | Payer: Medicare HMO | Source: Ambulatory Visit | Attending: Radiation Oncology | Admitting: Radiation Oncology

## 2016-05-18 ENCOUNTER — Ambulatory Visit: Payer: Medicare HMO | Admitting: Oncology

## 2016-05-18 DIAGNOSIS — Z510 Encounter for antineoplastic radiation therapy: Secondary | ICD-10-CM | POA: Diagnosis not present

## 2016-05-19 ENCOUNTER — Ambulatory Visit
Admission: RE | Admit: 2016-05-19 | Discharge: 2016-05-19 | Disposition: A | Discharge status: Home / Self Care | Payer: Medicare HMO | Source: Ambulatory Visit | Attending: Radiation Oncology | Admitting: Radiation Oncology

## 2016-05-19 DIAGNOSIS — Z510 Encounter for antineoplastic radiation therapy: Secondary | ICD-10-CM | POA: Diagnosis not present

## 2016-05-20 ENCOUNTER — Ambulatory Visit
Admission: RE | Admit: 2016-05-20 | Discharge: 2016-05-20 | Disposition: A | Discharge status: Home / Self Care | Payer: Medicare HMO | Source: Ambulatory Visit | Attending: Radiation Oncology | Admitting: Radiation Oncology

## 2016-05-20 DIAGNOSIS — Z510 Encounter for antineoplastic radiation therapy: Secondary | ICD-10-CM | POA: Diagnosis not present

## 2016-05-23 ENCOUNTER — Encounter: Payer: Self-pay | Admitting: Radiation Oncology

## 2016-05-23 ENCOUNTER — Ambulatory Visit
Admission: RE | Admit: 2016-05-23 | Discharge: 2016-05-23 | Disposition: A | Discharge status: Home / Self Care | Payer: Medicare HMO | Source: Ambulatory Visit | Attending: Radiation Oncology | Admitting: Radiation Oncology

## 2016-05-23 VITALS — BP 106/76 | HR 69 | Temp 98.0°F | Resp 16 | Wt 126.0 lb

## 2016-05-23 DIAGNOSIS — Z170 Estrogen receptor positive status [ER+]: Principal | ICD-10-CM

## 2016-05-23 DIAGNOSIS — Z510 Encounter for antineoplastic radiation therapy: Secondary | ICD-10-CM | POA: Diagnosis not present

## 2016-05-23 DIAGNOSIS — C50211 Malignant neoplasm of upper-inner quadrant of right female breast: Secondary | ICD-10-CM

## 2016-05-23 MED ORDER — RADIAPLEXRX EX GEL: Freq: Once | CUTANEOUS | Status: DC

## 2016-05-23 MED ORDER — ALRA NON-METALLIC DEODORANT (RAD-ONC): 1.0000 "application " | Freq: Once | TOPICAL | Status: DC

## 2016-05-23 NOTE — Progress Notes (Signed)
Weekly rad txs  Right breast 4/16 completed, no skin changes, incision scar pink, skin is intact, using radiaplex bid, no c/o pain,appetite, good,  1:57 PM BP 106/76 (BP Location: Left Arm, Patient Position: Sitting, Cuff Size: Normal)   Pulse 69   Temp 98 F (36.7 C) (Oral)   Resp 16   Wt 126 lb (57.2 kg)   BMI 21.63 kg/m   Wt Readings from Last 3 Encounters:  05/23/16 126 lb (57.2 kg)  05/06/16 125 lb (56.7 kg)  04/15/16 128 lb 3.2 oz (58.2 kg)

## 2016-05-23 NOTE — Progress Notes (Signed)
   Weekly Management Note:  Outpatient    ICD-9-CM ICD-10-CM   1. Malignant neoplasm of upper-inner quadrant of right breast in female, estrogen receptor positive (HCC) 174.2 C50.211 hyaluronate sodium (RADIAPLEXRX) gel   [id number] non-metallic deodorant (ALRA) 1 application    Current Dose:  10.64 Gy  Projected Dose: 42.56 Gy   Narrative:  The patient presents for routine under treatment assessment.  CBCT/MVCT images/Port film x-rays were reviewed.  The chart was checked. Doing well.  Physical Findings:  weight is 126 lb (57.2 kg). Her oral temperature is 98 F (36.7 C). Her blood pressure is 106/76 and her pulse is 69. Her respiration is 16.   Wt Readings from Last 3 Encounters:  05/23/16 126 lb (57.2 kg)  05/06/16 125 lb (56.7 kg)  04/15/16 128 lb 3.2 oz (58.2 kg)   Slight right breast erythema   Impression:  The patient is tolerating radiotherapy.  Plan:  Continue radiotherapy as planned. Patient instructed to apply Radiplex to intact skin in treatment fields.     ________________________________   Eppie Gibson, M.D.

## 2016-05-24 ENCOUNTER — Ambulatory Visit
Admission: RE | Admit: 2016-05-24 | Discharge: 2016-05-24 | Disposition: A | Discharge status: Home / Self Care | Payer: Medicare HMO | Source: Ambulatory Visit | Attending: Radiation Oncology | Admitting: Radiation Oncology

## 2016-05-24 DIAGNOSIS — Z510 Encounter for antineoplastic radiation therapy: Secondary | ICD-10-CM | POA: Diagnosis not present

## 2016-05-25 ENCOUNTER — Ambulatory Visit
Admission: RE | Admit: 2016-05-25 | Discharge: 2016-05-25 | Disposition: A | Discharge status: Home / Self Care | Payer: Medicare HMO | Source: Ambulatory Visit | Attending: Radiation Oncology | Admitting: Radiation Oncology

## 2016-05-25 ENCOUNTER — Telehealth: Payer: Self-pay | Admitting: *Deleted

## 2016-05-25 DIAGNOSIS — Z510 Encounter for antineoplastic radiation therapy: Secondary | ICD-10-CM | POA: Diagnosis not present

## 2016-05-25 NOTE — Telephone Encounter (Signed)
  Oncology Nurse Navigator Documentation  Navigator Location: [facility] (05/25/16 1300)   )Navigator Encounter Type: Telephone (05/25/16 1300) Telephone: [facility] Call (05/25/16 1300)                   Patient Visit Type: RadOnc (05/25/16 1300) Treatment Phase: First Radiation Tx (05/25/16 1300)                            Time Spent with Patient: 15 (05/25/16 1300)

## 2016-05-26 ENCOUNTER — Ambulatory Visit
Admission: RE | Admit: 2016-05-26 | Discharge: 2016-05-26 | Disposition: A | Discharge status: Home / Self Care | Payer: Medicare HMO | Source: Ambulatory Visit | Attending: Radiation Oncology | Admitting: Radiation Oncology

## 2016-05-26 DIAGNOSIS — Z510 Encounter for antineoplastic radiation therapy: Secondary | ICD-10-CM | POA: Diagnosis not present

## 2016-05-27 ENCOUNTER — Ambulatory Visit
Admission: RE | Admit: 2016-05-27 | Discharge: 2016-05-27 | Disposition: A | Discharge status: Home / Self Care | Payer: Medicare HMO | Source: Ambulatory Visit | Attending: Radiation Oncology | Admitting: Radiation Oncology

## 2016-05-27 DIAGNOSIS — Z510 Encounter for antineoplastic radiation therapy: Secondary | ICD-10-CM | POA: Diagnosis not present

## 2016-05-30 ENCOUNTER — Ambulatory Visit
Admission: RE | Admit: 2016-05-30 | Discharge: 2016-05-30 | Disposition: A | Discharge status: Home / Self Care | Payer: Medicare HMO | Source: Ambulatory Visit | Attending: Radiation Oncology | Admitting: Radiation Oncology

## 2016-05-30 ENCOUNTER — Encounter: Payer: Self-pay | Admitting: Radiation Oncology

## 2016-05-30 VITALS — BP 107/62 | HR 77 | Temp 98.4°F | Ht 64.0 in | Wt 126.2 lb

## 2016-05-30 DIAGNOSIS — Z510 Encounter for antineoplastic radiation therapy: Secondary | ICD-10-CM | POA: Diagnosis not present

## 2016-05-30 DIAGNOSIS — Z170 Estrogen receptor positive status [ER+]: Principal | ICD-10-CM

## 2016-05-30 DIAGNOSIS — C50211 Malignant neoplasm of upper-inner quadrant of right female breast: Secondary | ICD-10-CM

## 2016-05-30 NOTE — Progress Notes (Signed)
Ms. Archacki is here for her 9th fraction of radiation to her Right Breast. She denies pain or fatigue. She has redness to her Right Breast. There is also an area of redness above her Right Breast. She reports itching to this area at times which is relieved with the radiaplex cream which she uses twice daily.  BP 107/62   Pulse 77   Temp 98.4 F (36.9 C)   Ht 5\' 4"  (1.626 m)   Wt 126 lb 3.2 oz (57.2 kg)   SpO2 99% Comment: room air  BMI 21.66 kg/m    Wt Readings from Last 3 Encounters:  05/30/16 126 lb 3.2 oz (57.2 kg)  05/23/16 126 lb (57.2 kg)  05/06/16 125 lb (56.7 kg)

## 2016-05-30 NOTE — Progress Notes (Signed)
   Weekly Management Note:  Outpatient    ICD-9-CM ICD-10-CM   1. Malignant neoplasm of upper-inner quadrant of right breast in female, estrogen receptor positive (HCC) 174.2 C50.211    V86.0 Z17.0     Current Dose: 24.03 Gy  Projected Dose: 42.56 Gy   Narrative:  The patient presents for routine under treatment assessment.  CBCT/MVCT images/Port film x-rays were reviewed.  The chart was checked. Doing well. Occasional pruritis over right breast  Physical Findings:  height is 5\' 4"  (1.626 m) and weight is 126 lb 3.2 oz (57.2 kg). Her temperature is 98.4 F (36.9 C). Her blood pressure is 107/62 and her pulse is 77. Her oxygen saturation is 99%.   Wt Readings from Last 3 Encounters:  05/30/16 126 lb 3.2 oz (57.2 kg)  05/23/16 126 lb (57.2 kg)  05/06/16 125 lb (56.7 kg)  skin intact, moderate right breast erythema   Impression:  The patient is tolerating radiotherapy.  Plan:  Continue radiotherapy as planned. Patient instructed to apply Radiplex to intact skin in treatment fields.     ________________________________   Eppie Gibson, M.D.

## 2016-05-31 ENCOUNTER — Ambulatory Visit
Admission: RE | Admit: 2016-05-31 | Discharge: 2016-05-31 | Disposition: A | Discharge status: Home / Self Care | Payer: Medicare HMO | Source: Ambulatory Visit | Attending: Radiation Oncology | Admitting: Radiation Oncology

## 2016-05-31 ENCOUNTER — Encounter: Payer: Self-pay | Admitting: Radiation Oncology

## 2016-05-31 DIAGNOSIS — Z510 Encounter for antineoplastic radiation therapy: Secondary | ICD-10-CM | POA: Diagnosis not present

## 2016-05-31 NOTE — Progress Notes (Signed)
Received paperwork (aflac) from patient given to nurse 11/8

## 2016-06-01 ENCOUNTER — Ambulatory Visit
Admission: RE | Admit: 2016-06-01 | Discharge: 2016-06-01 | Disposition: A | Discharge status: Home / Self Care | Payer: Medicare HMO | Source: Ambulatory Visit | Attending: Radiation Oncology | Admitting: Radiation Oncology

## 2016-06-01 DIAGNOSIS — Z510 Encounter for antineoplastic radiation therapy: Secondary | ICD-10-CM | POA: Diagnosis not present

## 2016-06-02 ENCOUNTER — Ambulatory Visit
Admission: RE | Admit: 2016-06-02 | Discharge: 2016-06-02 | Disposition: A | Discharge status: Home / Self Care | Payer: Medicare HMO | Source: Ambulatory Visit | Attending: Radiation Oncology | Admitting: Radiation Oncology

## 2016-06-02 DIAGNOSIS — Z510 Encounter for antineoplastic radiation therapy: Secondary | ICD-10-CM | POA: Diagnosis not present

## 2016-06-03 ENCOUNTER — Ambulatory Visit
Admission: RE | Admit: 2016-06-03 | Discharge: 2016-06-03 | Disposition: A | Discharge status: Home / Self Care | Payer: Medicare HMO | Source: Ambulatory Visit | Attending: Radiation Oncology | Admitting: Radiation Oncology

## 2016-06-03 DIAGNOSIS — Z510 Encounter for antineoplastic radiation therapy: Secondary | ICD-10-CM | POA: Diagnosis not present

## 2016-06-06 ENCOUNTER — Ambulatory Visit
Admission: RE | Admit: 2016-06-06 | Discharge: 2016-06-06 | Disposition: A | Discharge status: Home / Self Care | Payer: Medicare HMO | Source: Ambulatory Visit | Attending: Radiation Oncology | Admitting: Radiation Oncology

## 2016-06-06 ENCOUNTER — Encounter: Payer: Self-pay | Admitting: Radiation Oncology

## 2016-06-06 VITALS — BP 122/71 | HR 80 | Temp 97.9°F | Ht 64.0 in | Wt 125.6 lb

## 2016-06-06 DIAGNOSIS — Z510 Encounter for antineoplastic radiation therapy: Secondary | ICD-10-CM | POA: Diagnosis not present

## 2016-06-06 DIAGNOSIS — Z170 Estrogen receptor positive status [ER+]: Secondary | ICD-10-CM | POA: Insufficient documentation

## 2016-06-06 DIAGNOSIS — C50211 Malignant neoplasm of upper-inner quadrant of right female breast: Secondary | ICD-10-CM | POA: Insufficient documentation

## 2016-06-06 MED ORDER — RADIAPLEXRX EX GEL
Freq: Once | CUTANEOUS | Status: AC
  Administered 2016-06-06: 15:00:00 via TOPICAL

## 2016-06-06 NOTE — Progress Notes (Signed)
Ms. Phin is here for her 14th fraction of radiation to her Right Breast. She denies pain or fatigue. Her Right Breast is red. She does have increased irritation to the upper inner quadrant of her Right Breast. She is using hydrocortisone at times to relieve the itching she experiences to this area. She is using the Radiaplex twice daily and was given a second tube today.She was also given an one month follow up appointment.  BP 122/71   Pulse 80   Temp 97.9 F (36.6 C)   Ht 5\' 4"  (1.626 m)   Wt 125 lb 9.6 oz (57 kg)   SpO2 99% Comment: room air  BMI 21.56 kg/m    Wt Readings from Last 3 Encounters:  06/06/16 125 lb 9.6 oz (57 kg)  05/30/16 126 lb 3.2 oz (57.2 kg)  05/23/16 126 lb (57.2 kg)

## 2016-06-06 NOTE — Progress Notes (Signed)
   Weekly Management Note:  Outpatient    ICD-9-CM ICD-10-CM   1. Malignant neoplasm of upper-inner quadrant of right breast in female, estrogen receptor positive (HCC) 174.2 C50.211 hyaluronate sodium (RADIAPLEXRX) gel   V86.0 Z17.0     Current Dose: 37.24 Gy  Projected Dose: 42.56 Gy   Narrative:  The patient presents for routine under treatment assessment.  CBCT/MVCT images/Port film x-rays were reviewed.  The chart was checked. Doing well. Some itching   Physical Findings:  height is 5\' 4"  (1.626 m) and weight is 125 lb 9.6 oz (57 kg). Her temperature is 97.9 F (36.6 C). Her blood pressure is 122/71 and her pulse is 80. Her oxygen saturation is 99%.   Wt Readings from Last 3 Encounters:  06/06/16 125 lb 9.6 oz (57 kg)  05/30/16 126 lb 3.2 oz (57.2 kg)  05/23/16 126 lb (57.2 kg)  skin intact, moderate right breast erythema  with UIQ dermatitis  Impression:  The patient is tolerating radiotherapy.  Plan:  Continue radiotherapy as planned. Patient instructed to apply Radiplex to intact skin in treatment fields. hydrocortisone 1% cream to be used over pruritic areas. F/u in 77mo   ________________________________   Eppie Gibson, M.D.

## 2016-06-07 ENCOUNTER — Ambulatory Visit
Admission: RE | Admit: 2016-06-07 | Discharge: 2016-06-07 | Disposition: A | Discharge status: Home / Self Care | Payer: Medicare HMO | Source: Ambulatory Visit | Attending: Radiation Oncology | Admitting: Radiation Oncology

## 2016-06-07 ENCOUNTER — Encounter: Payer: Self-pay | Admitting: Radiation Oncology

## 2016-06-07 DIAGNOSIS — Z510 Encounter for antineoplastic radiation therapy: Secondary | ICD-10-CM | POA: Diagnosis not present

## 2016-06-07 NOTE — Progress Notes (Signed)
Paperwork (aflac) received back, our portion (rad onc) completed patient will take paperwork to surgeon to get filled out, no further needed from our office

## 2016-06-08 ENCOUNTER — Ambulatory Visit
Admission: RE | Admit: 2016-06-08 | Discharge: 2016-06-08 | Disposition: A | Discharge status: Home / Self Care | Payer: Medicare HMO | Source: Ambulatory Visit | Attending: Radiation Oncology | Admitting: Radiation Oncology

## 2016-06-08 ENCOUNTER — Telehealth: Payer: Self-pay | Admitting: *Deleted

## 2016-06-08 ENCOUNTER — Encounter: Payer: Self-pay | Admitting: Radiation Oncology

## 2016-06-08 DIAGNOSIS — Z510 Encounter for antineoplastic radiation therapy: Secondary | ICD-10-CM | POA: Diagnosis not present

## 2016-06-08 NOTE — Telephone Encounter (Signed)
  Oncology Nurse Navigator Documentation  Navigator Location: [facility] (06/08/16 1500)   )Navigator Encounter Type: Telephone (06/08/16 1500) Telephone: Outgoing Call (06/08/16 1500)                   Patient Visit Type: RadOnc (06/08/16 1500) Treatment Phase: Final Radiation Tx (06/08/16 1500)                            Time Spent with Patient: 15 (06/08/16 1500)    

## 2016-06-15 NOTE — Progress Notes (Signed)
  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     Indication for treatment: Curative   Radiation treatment dates:   05/18/16 - 06/08/16  Site/dose:   Right breast: 42.56 Gy in 16 fractions.  Beams/energy:   3D // 6X Photon  Narrative: The patient tolerated radiation treatment relatively well. The patient developed pruritis, moderate erythema, and UIQ dermatitis of the right breast during the later stages of treatment.  Plan: The patient has completed radiation treatment. The patient will return to radiation oncology clinic for routine followup in one month. I advised them to call or return sooner if they have any questions or concerns related to their recovery or treatment.  -----------------------------------  Eppie Gibson, MD  This document serves as a record of services personally performed by Eppie Gibson, MD. It was created on her behalf by Darcus Austin, a trained medical scribe. The creation of this record is based on the scribe's personal observations and the provider's statements to them. This document has been checked and approved by the attending provider.

## 2016-06-20 ENCOUNTER — Ambulatory Visit: Payer: Medicare HMO | Admitting: Oncology

## 2016-06-22 ENCOUNTER — Ambulatory Visit (HOSPITAL_BASED_OUTPATIENT_CLINIC_OR_DEPARTMENT_OTHER): Payer: Medicare HMO | Admitting: Oncology

## 2016-06-22 VITALS — BP 126/76 | HR 84 | Temp 98.2°F | Resp 18 | Ht 64.0 in | Wt 127.5 lb

## 2016-06-22 DIAGNOSIS — Z170 Estrogen receptor positive status [ER+]: Secondary | ICD-10-CM | POA: Diagnosis not present

## 2016-06-22 DIAGNOSIS — C50211 Malignant neoplasm of upper-inner quadrant of right female breast: Secondary | ICD-10-CM | POA: Diagnosis not present

## 2016-06-22 MED ORDER — TAMOXIFEN CITRATE 20 MG PO TABS: 20.0000 mg | ORAL_TABLET | Freq: Every day | ORAL | 12 refills | Status: DC

## 2016-06-22 NOTE — Progress Notes (Signed)
[facility]  [phone] [phone]     ID: Robyn Hobbs DOB: 05/04/1951  MR#: [id number]  CSN#:[id number]  Patient Care Team: David M Haimes, MD as PCP - General (Family Medicine) Paul Toth III, MD as Consulting Physician (General Surgery) Gustav C Magrinat, MD as Consulting Physician (Oncology) Sarah Squire, MD as Attending Physician (Radiation Oncology) Richard V Fletcher, MD (Obstetrics and Gynecology) Amy Jordan, MD as Consulting Physician (Dermatology) OTHER MD:  CHIEF COMPLAINT: Estrogen receptor positive breast cancer  CURRENT TREATMENT: Tamoxifen  BREAST CANCER HISTORY: From the original intake note:  [name] had screening mammography suggesting a possible mass in the right breast and [facility] diagnostic mammography with tomography and right breast ultrasonography was performed 02/17/2016. The breast density was category C. There was set up mass in the posterior third of the upper central right breast which was not palpable by exam. Ultrasonography confirmed an irregular hypoechoic mass measuring approximately 0.9 cm. Survey of the right axilla was unremarkable.  Biopsy of the right breast mass in question 02/19/2016 showed ([id number]) an invasive ductal carcinoma, grade 1, estrogen receptor 100% positive, with strong staining intensity, progesterone receptor negative, with an MIB-1 of 2%, and no HER-2 amplification, the signals ratio being 1.00 and the number per cell 1.05.  Her subsequent history is as detailed below.  INTERVAL HISTORY: Robyn Hobbs returns today for follow-up of her estrogen receptor positive breast cancer. After her last visit here she proceeded to radiation, which she completed earlier this month. She generally did well with that, with her skin "holding up okay", despite some peeling and some itching. She had mild fatigue. In fact she is a little bit more fatigued now than she was during the  treatments.   REVIEW OF SYSTEMS: [name] has a little bit of tenderness to the touch in the slightly raw areas of skin but otherwise a detailed review of systems today was entirely benign   PAST MEDICAL HISTORY: Past Medical History:  Diagnosis Date  . Breast cancer of upper-inner quadrant of right female breast (HCC) 02/25/2016  . Increased liver enzymes     PAST SURGICAL HISTORY: Past Surgical History:  Procedure Laterality Date  . BREAST LUMPECTOMY WITH RADIOACTIVE SEED AND SENTINEL LYMPH NODE BIOPSY Right 03/17/2016   Procedure: BREAST LUMPECTOMY WITH RADIOACTIVE SEED AND SENTINEL LYMPH NODE BIOPSY;  Surgeon: Paul Toth III, MD;  Location: [facility];  Service: General;  Laterality: Right;  BREAST LUMPECTOMY WITH RADIOACTIVE SEED AND SENTINEL LYMPH NODE BIOPSY  . COLONOSCOPY  multiple  . Excision of Melanoma  on back    . TONSILLECTOMY     and adenoidectomy  . VAGINAL HYSTERECTOMY  2001    FAMILY HISTORY Family History  Problem Relation Age of Onset  . Kidney disease Father   . Colon cancer Maternal Grandmother   . Colon cancer Paternal Grandfather   . Colon cancer Paternal Grandmother   The patient's father died at the age of 50 from kidney cancer. The patient's mother died at the age of 93. The patient had one brother, no sisters. On the mother's side both the grandmother and grandfather had colon cancer diagnosed in their 80s. The patient's brother had prostate cancer at age 62. The patient's paternal grandfather was diagnosed with lung cancer in his late 70s. There is no history of breast or ovarian cancer in the family  GYNECOLOGIC HISTORY:  No LMP recorded. Patient is postmenopausal. Menarche age 11, first live   birth age 36, the patient is GX P3. She underwent full abdominal hysterectomy with bilateral salpingo-oophorectomy June 2001. She took hormone replacement for less than a year, stopping in 2002. She used oral contraceptives for approximately 5 years  remotely without complications.  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 Her husband Reynolds Bowl") used to work in [employer] but is now retired. Son [name] lives in [place] and is a Hydrologist for [employer], son [name] lives in [place] and is a serious of for Multimedia programmer, Jacqulynn Cadet Lives in [place] Where He Works in [employer]. The Patient Has 6 Grandchildren. She Attends a [employer].    ADVANCED DIRECTIVES: In place   HEALTH MAINTENANCE: Social History  Substance Use Topics  . Smoking status: Never Smoker  . Smokeless tobacco: Never Used  . Alcohol use 1.8 oz/week    3 Glasses of wine per week     Comment: she is not drinking at this time.      Colonoscopy: 2014/lobe our  PAP:  Bone density: July 2017   Allergies  Allergen Reactions  . Morphine And Related Swelling    Current Outpatient Prescriptions  Medication Sig Dispense Refill  . beta carotene w/minerals (OCUVITE) tablet Take 1 tablet by mouth daily.    . calcium carbonate (OS-CAL) 600 MG TABS Take 600 mg by mouth 2 (two) times daily with a meal.    . cholecalciferol (VITAMIN D) 1000 UNITS tablet Take 2,000 Units by mouth daily.    . Multiple Vitamin (MULTIVITAMIN) tablet Take 1 tablet by mouth daily.    . tamoxifen (NOLVADEX) 20 MG tablet Take 1 tablet (20 mg total) by mouth daily. 90 tablet 12   No current facility-administered medications for this visit.     OBJECTIVE: Middle-aged white woman Who appears stated age 65:   06/22/16 1516  BP: 126/76  Pulse: 84  Resp: 18  Temp: 98.2 F (36.8 C)     Body mass index is 21.89 kg/m.    ECOG FS:1 - Symptomatic but completely ambulatory  Sclerae unicteric, EOMs intact Oropharynx clear and moist No cervical or supraclavicular adenopathy Lungs no rales or rhonchi Heart regular rate and rhythm Abd soft, nontender, positive bowel sounds MSK no focal spinal tenderness, no upper  extremity lymphedema Neuro: nonfocal, well oriented, appropriate affect Breasts: The right breast is status post lumpectomy and radiation. There is continuing erythema over the radiation port area but no longer any desquamation. The cosmetic result is excellent. The right axilla is benign. The left breast is unremarkable.   LAB RESULTS:  CMP     Component Value Date/Time   NA 139 03/02/2016 1238   K 4.0 03/02/2016 1238   CO2 27 03/02/2016 1238   GLUCOSE 168 (H) 03/02/2016 1238   BUN 18.8 03/02/2016 1238   CREATININE 1.0 03/02/2016 1238   CALCIUM 9.8 03/02/2016 1238   PROT 7.3 03/02/2016 1238   ALBUMIN 4.4 03/02/2016 1238   AST 20 03/02/2016 1238   ALT 26 03/02/2016 1238   ALKPHOS 62 03/02/2016 1238   BILITOT 0.57 03/02/2016 1238    INo results found for: SPEP, UPEP  Lab Results  Component Value Date   WBC 8.7 03/02/2016   NEUTROABS 6.8 (H) 03/02/2016   HGB 14.1 03/02/2016   HCT 43.4 03/02/2016   MCV 86.7 03/02/2016   PLT 218 03/02/2016      Chemistry      Component Value Date/Time   NA 139  03/02/2016 1238   K 4.0 03/02/2016 1238   CO2 27 03/02/2016 1238   BUN 18.8 03/02/2016 1238   CREATININE 1.0 03/02/2016 1238      Component Value Date/Time   CALCIUM 9.8 03/02/2016 1238   ALKPHOS 62 03/02/2016 1238   AST 20 03/02/2016 1238   ALT 26 03/02/2016 1238   BILITOT 0.57 03/02/2016 1238       No results found for: LABCA2  No components found for: LABCA125  No results for input(s): INR in the last 168 hours.  Urinalysis No results found for: COLORURINE, APPEARANCEUR, LABSPEC, PHURINE, GLUCOSEU, HGBUR, BILIRUBINUR, KETONESUR, PROTEINUR, UROBILINOGEN, NITRITE, LEUKOCYTESUR   STUDIES: No results found.  ELIGIBLE FOR AVAILABLE RESEARCH PROTOCOL: no  ASSESSMENT: 65 y.o. [place], [place] woman status post right breast upper inner quadrant biopsy 02/19/2016 for a clinical T1b N0, clinical stage IA invasive ductal carcinoma, grade 1, estrogen receptor positive,  progesterone receptor and HER-2 negative, with an MIB-1 of 2.   (1) status post right lumpectomy and sentinel lymph node sampling 05/17/2016 for a pT1c pN0, stage IA invasive ductal carcinoma, grade 1, with negative margins  (2) Oncotype DX score of 20 predicts a 10 year risk of recurrence outside the breast of 13% if the patient's only systemic treatment is tamoxifen for 5 years  (a) given the marginal predicted benefit from chemotherapy, the patient opted against that  (3) adjuvant radiation 05/18/16 - 06/08/16:  Right breast: 42.56 Gy in 16 fractions.  (4) to start tamoxifen 07/25/2016  PLAN: Georgeann has completed her local treatment and is now ready to start systemic therapy for her breast cancer.  We spent approximately 30 minutes reviewing her situation. Her Oncotype predicts an 87% chance of this breast cancer not recurring outside the breast in the next 10 years if she takes tamoxifen for 5 years.  She can improve on those numbers by either taking tamoxifen for 10 years or taking anastrozole for 5 years or taking anastrozole for 2 years after taking tamoxifen for 3 years.  We then reviewed the possible toxicities, side effects and complications of these drugs. That information was also given to her in writing.  After much discussion we decided she will start on tamoxifen. If she tolerates it well we will continue it for at least 3 years at which point she would decide if she wants to give anastrozole a try for an additional 2 years or simply continue tamoxifen for an additional 7.  I would like her to   more completely recover from her radiation treatments so we're not going to start the tamoxifen until 07/25/2016 accordingly she will see me again in March. If she is tolerating tamoxifen well at that time I will see her 6 months after that and then likely once a year  She has undergone good understanding of this plan. She agrees with it. She knows the goal of treatment in her case is  cure. She will call with any problems that may develop before her next visit.   MAGRINAT,GUSTAV C, MD   06/22/2016 3:38 PM Medical Oncology and Hematology [facility] [address] [place], [place] [address] Tel. [phone]    Fax. [phone] 

## 2016-07-05 ENCOUNTER — Encounter: Payer: Self-pay | Admitting: Radiation Oncology

## 2016-07-08 ENCOUNTER — Encounter: Payer: Self-pay | Admitting: Radiation Oncology

## 2016-07-08 ENCOUNTER — Ambulatory Visit
Admission: RE | Admit: 2016-07-08 | Discharge: 2016-07-08 | Disposition: A | Discharge status: Home / Self Care | Payer: Medicare HMO | Source: Ambulatory Visit | Attending: Radiation Oncology | Admitting: Radiation Oncology

## 2016-07-08 DIAGNOSIS — Z79899 Other long term (current) drug therapy: Secondary | ICD-10-CM | POA: Insufficient documentation

## 2016-07-08 DIAGNOSIS — Z170 Estrogen receptor positive status [ER+]: Secondary | ICD-10-CM | POA: Diagnosis not present

## 2016-07-08 DIAGNOSIS — C50211 Malignant neoplasm of upper-inner quadrant of right female breast: Secondary | ICD-10-CM | POA: Insufficient documentation

## 2016-07-08 HISTORY — DX: Personal history of irradiation: Z92.3

## 2016-07-08 NOTE — Progress Notes (Addendum)
Ms. Riser presents for follow up of radiation completed 06/08/16 to her Right Breast. She denies pain or fatigue. She reports her skin has healed well. She used a cream given to her by her Daughter-in-law who is a dermatologist called Clobetasol and healed quickly after using it for several days. She is using Radiaplex currently and will use Vitamin E lotion when the Radiaplex is completed. She plans to start Tamoxifen on 07/25/16.    BP 107/75   Pulse 77   Temp 98.1 F (36.7 C)   Ht 5\' 4"  (1.626 m)   Wt 125 lb 9.6 oz (57 kg)   SpO2 98% Comment: room air  BMI 21.56 kg/m    Wt Readings from Last 3 Encounters:  07/08/16 125 lb 9.6 oz (57 kg)  06/22/16 127 lb 8 oz (57.8 kg)  06/06/16 125 lb 9.6 oz (57 kg)

## 2016-07-08 NOTE — Progress Notes (Signed)
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      ICD-9-CM ICD-10-CM   1. Malignant neoplasm of upper-inner quadrant of right breast in female, estrogen receptor positive ([place]) 174.2 C50.211    V86.0 Z17.0     Radiation treatment dates:   05/18/16 - 06/08/16 Site/dose:   Right breast: 42.56 Gy in 16 fractions.  CHIEF COMPLAINT: Here for follow-up and surveillance of right breast cancer  Narrative:  The patient returns today for routine follow-up of radiation completed 06/08/2016 to her Right breast. She denies pain or fatigue. She reports her skin has healed well. She used a cream given to her by her daughter-in-law who is a dermatologist called Clobetasol and healed quickly after using it for several days. She is using Radiaplex currently and will use Vitamin E lotion when the Radiaplex is completed. She plans to start Tamoxifen on 07/25/16.                                 ALLERGIES:  is allergic to morphine and related.  Meds: Current Outpatient Prescriptions  Medication Sig Dispense Refill  . beta carotene w/minerals (OCUVITE) tablet Take 1 tablet by mouth daily.    . calcium carbonate (OS-CAL) 600 MG TABS Take 600 mg by mouth 2 (two) times daily with a meal.    . cholecalciferol (VITAMIN D) 1000 UNITS tablet Take 2,000 Units by mouth daily.    . Multiple Vitamin (MULTIVITAMIN) tablet Take 1 tablet by mouth daily.    . tamoxifen (NOLVADEX) 20 MG tablet Take 1 tablet (20 mg total) by mouth daily. (Patient not taking: Reported on 07/08/2016) 90 tablet 12   No current facility-administered medications for this encounter.     Physical Findings:  height is _0  (1.626 m) and weight is 125 lb 9.6 oz (57 kg). Her temperature is 98.1 F (36.7 C). Her blood pressure is 107/75 and her pulse is 77. Her oxygen saturation is 98%. .    General: Alert and oriented, in no acute distress HEENT: Head is normocephalic. Neurologic: Speech is fluent. Coordination is intact. Psychiatric: Judgment and insight are intact. Affect is appropriate. Breast: Moderate residual erythema in the right breast and skin is not dry.   Lab Findings: Lab Results  Component Value Date   WBC 8.7 03/02/2016   HGB 14.1 03/02/2016   HCT 43.4 03/02/2016   MCV 86.7 03/02/2016   PLT 218 03/02/2016    Radiographic Findings: No results found.  Impression/Plan:  Breat cancer, Recovering well from radiation treatment. I encouraged her to use Vitamin E lotion in the mornings and evenings for the next few months to promote healing and avoid dryness.   I encouraged her to continue with yearly mammography and followup with medical oncology. I will see her back on an as-needed basis. I have encouraged her to call if she has any issues or concerns in the future. I wished her the very best.  _____________________________________   Eppie Gibson, MD   This document serves as a record of services personally performed by Eppie Gibson, MD. It was created  on her behalf by Arlyce Harman, a trained medical scribe. The creation of this record is based on the scribe's personal observations and the provider's statements to them. This document has been checked and approved by the attending provider.

## 2016-09-23 ENCOUNTER — Telehealth: Payer: Self-pay | Admitting: Oncology

## 2016-09-23 NOTE — Telephone Encounter (Signed)
R/s lab/md visit on 3/28 to 4/11. No answer, left message and sending letter in mail.

## 2016-10-17 ENCOUNTER — Telehealth: Payer: Self-pay | Admitting: Oncology

## 2016-10-17 NOTE — Telephone Encounter (Signed)
lvm to inform pt of SCP in June per LOS

## 2016-10-19 ENCOUNTER — Other Ambulatory Visit: Payer: Medicare HMO

## 2016-10-19 ENCOUNTER — Ambulatory Visit: Payer: Medicare HMO | Admitting: Oncology

## 2016-11-02 ENCOUNTER — Ambulatory Visit (HOSPITAL_BASED_OUTPATIENT_CLINIC_OR_DEPARTMENT_OTHER): Payer: Medicare HMO | Admitting: Oncology

## 2016-11-02 ENCOUNTER — Other Ambulatory Visit (HOSPITAL_BASED_OUTPATIENT_CLINIC_OR_DEPARTMENT_OTHER): Payer: Medicare HMO

## 2016-11-02 VITALS — BP 128/61 | HR 62 | Temp 98.0°F | Resp 18 | Ht 64.0 in | Wt 127.8 lb

## 2016-11-02 DIAGNOSIS — Z170 Estrogen receptor positive status [ER+]: Secondary | ICD-10-CM | POA: Diagnosis not present

## 2016-11-02 DIAGNOSIS — C50211 Malignant neoplasm of upper-inner quadrant of right female breast: Secondary | ICD-10-CM

## 2016-11-02 DIAGNOSIS — Z79811 Long term (current) use of aromatase inhibitors: Secondary | ICD-10-CM | POA: Diagnosis not present

## 2016-11-02 LAB — CBC WITH DIFFERENTIAL/PLATELET
BASO%: 0.7 % (ref 0.0–2.0)
Basophils Absolute: 0 10*3/uL (ref 0.0–0.1)
EOS%: 6 % (ref 0.0–7.0)
Eosinophils Absolute: 0.4 10*3/uL (ref 0.0–0.5)
HCT: 43.6 % (ref 34.8–46.6)
HGB: 14.6 g/dL (ref 11.6–15.9)
LYMPH#: 1.6 10*3/uL (ref 0.9–3.3)
LYMPH%: 25.5 % (ref 14.0–49.7)
MCH: 28.3 pg (ref 25.1–34.0)
MCHC: 33.5 g/dL (ref 31.5–36.0)
MCV: 84.6 fL (ref 79.5–101.0)
MONO#: 0.4 10*3/uL (ref 0.1–0.9)
MONO%: 7 % (ref 0.0–14.0)
NEUT%: 60.8 % (ref 38.4–76.8)
NEUTROS ABS: 3.9 10*3/uL (ref 1.5–6.5)
PLATELETS: 206 10*3/uL (ref 145–400)
RBC: 5.16 10*6/uL (ref 3.70–5.45)
RDW: 13.6 % (ref 11.2–14.5)
WBC: 6.4 10*3/uL (ref 3.9–10.3)

## 2016-11-02 LAB — COMPREHENSIVE METABOLIC PANEL
ALT: 25 U/L (ref 0–55)
ANION GAP: 10 meq/L (ref 3–11)
AST: 22 U/L (ref 5–34)
Albumin: 4.1 g/dL (ref 3.5–5.0)
Alkaline Phosphatase: 51 U/L (ref 40–150)
BILIRUBIN TOTAL: 0.67 mg/dL (ref 0.20–1.20)
BUN: 20.7 mg/dL (ref 7.0–26.0)
CALCIUM: 9.9 mg/dL (ref 8.4–10.4)
CHLORIDE: 101 meq/L (ref 98–109)
CO2: 29 meq/L (ref 22–29)
CREATININE: 1 mg/dL (ref 0.6–1.1)
EGFR: 60 mL/min/{1.73_m2} — ABNORMAL LOW (ref >90)
Glucose: 92 mg/dl (ref 70–140)
Potassium: 4.4 mEq/L (ref 3.5–5.1)
Sodium: 140 mEq/L (ref 136–145)
TOTAL PROTEIN: 6.8 g/dL (ref 6.4–8.3)

## 2016-11-02 NOTE — Progress Notes (Signed)
[facility]  Telephone:(336) [phone] Fax:(336) [phone]     ID: Robyn Hobbs DOB: 1951/01/30  MR#: [id number]  SUP#:[id number]  Patient Care Team: Karlene Einstein, MD as PCP - General (Family Medicine) Autumn Messing III, MD as Consulting Physician (General Surgery) Chauncey Cruel, MD as Consulting Physician (Oncology) Eppie Gibson, MD as Attending Physician (Radiation Oncology) Luellen Pucker, MD (Obstetrics and Gynecology) Amy Martinique, MD as Consulting Physician (Dermatology) OTHER MD:  CHIEF COMPLAINT: Estrogen receptor positive breast cancer  CURRENT TREATMENT: Tamoxifen  BREAST CANCER HISTORY: From the original intake note:  Robyn Hobbs had screening mammography suggesting a possible mass in the right breast and [facility] was [facility] right diagnostic mammography with tomography and right breast ultrasonography was performed 02/17/2016. The breast density was category C. There was set up mass in the posterior third of the upper central right breast which was not palpable by exam. Ultrasonography confirmed an irregular hypoechoic mass measuring approximately 0.9 cm. Survey of the right axilla was unremarkable.  Biopsy of the right breast mass in question 02/19/2016 showed ([id number]) an invasive ductal carcinoma, grade 1, estrogen receptor 100% positive, with strong staining intensity, progesterone receptor negative, with an MIB-1 of 2%, and no HER-2 amplification, the signals ratio being 1.00 and the number per cell 1.05.  Her subsequent history is as detailed below.  INTERVAL HISTORY: Robyn Hobbs returns today for follow-up of her estrogen receptor positive breast cancer. Interval history is generally benign. She started tamoxifen in early January. She is doing beautifully with it. Hot flashes are not too bad, certainly much less than they used to be when she first went through menopause. She is not having any vaginal wetness problems. She obtains a drug at a  very good price.   REVIEW OF SYSTEMS: Stress urinary incontinence is a problem and she has been referred to a urologist in [facility] to deal with that. She is exercising regularly. Overall a detailed review of systems today was noncontributory  PAST MEDICAL HISTORY: Past Medical History:  Diagnosis Date  . Breast cancer of upper-inner quadrant of right female breast ([place]) 02/25/2016  . History of radiation therapy 05/18/16- 06/08/16   Right Breast 42.56 Gy in 16 fractions.   . Increased liver enzymes     PAST SURGICAL HISTORY: Past Surgical History:  Procedure Laterality Date  . BREAST LUMPECTOMY WITH RADIOACTIVE SEED AND SENTINEL LYMPH NODE BIOPSY Right 03/17/2016   Procedure: BREAST LUMPECTOMY WITH RADIOACTIVE SEED AND SENTINEL LYMPH NODE BIOPSY;  Surgeon: Autumn Messing III, MD;  Location: [facility];  Service: General;  Laterality: Right;  BREAST LUMPECTOMY WITH RADIOACTIVE SEED AND SENTINEL LYMPH NODE BIOPSY  . COLONOSCOPY  multiple  . Excision of Melanoma  on back    . TONSILLECTOMY     and adenoidectomy  . VAGINAL HYSTERECTOMY  2001    FAMILY HISTORY Family History  Problem Relation Age of Onset  . Kidney disease Father   . Colon cancer Maternal Grandmother   . Colon cancer Paternal Grandfather   . Colon cancer Paternal Grandmother   The patient's father died at the age of 14 from kidney cancer. The patient's mother died at the age of 19. The patient had one brother, no sisters. On the mother's side both the grandmother and grandfather had colon cancer diagnosed in their 74s. The patient's brother had prostate cancer at age 29. The patient's paternal grandfather was diagnosed with lung cancer in his late 7s. There is no  history of breast or ovarian cancer in the family  GYNECOLOGIC HISTORY:  No LMP recorded. Patient is postmenopausal. Menarche age 1, first live birth age 32, the patient is [place] P3. She underwent full abdominal hysterectomy with bilateral  salpingo-oophorectomy June 2001. She took hormone replacement for less than a year, stopping in 2002. She used oral contraceptives for approximately 5 years remotely without complications.  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 Her husband Robyn Hobbs") used to work in [employer] but is now retired. Son Robyn Hobbs lives in [place] and is a Hydrologist for [employer], son Robyn Hobbs lives in [place] and is a serious of for Multimedia programmer, Robyn Hobbs Lives in [place] Where He Works in [employer]. The Patient Has 6 Grandchildren. She Attends a [employer].    ADVANCED DIRECTIVES: In place   HEALTH MAINTENANCE: Social History  Substance Use Topics  . Smoking status: Never Smoker  . Smokeless tobacco: Never Used  . Alcohol use 1.8 oz/week    3 Glasses of wine per week     Comment: she is not drinking at this time.      Colonoscopy: 2014/lobe our  PAP:  Bone density: July 2017   Allergies  Allergen Reactions  . Morphine And Related Swelling    Current Outpatient Prescriptions  Medication Sig Dispense Refill  . beta carotene w/minerals (OCUVITE) tablet Take 1 tablet by mouth daily.    . calcium carbonate (OS-CAL) 600 MG TABS Take 600 mg by mouth 2 (two) times daily with a meal.    . cholecalciferol (VITAMIN D) 1000 UNITS tablet Take 2,000 Units by mouth daily.    . Multiple Vitamin (MULTIVITAMIN) tablet Take 1 tablet by mouth daily.     No current facility-administered medications for this visit.     OBJECTIVE: Middle-aged white woman In no acute distress  Vitals:   11/02/16 1050  BP: 128/61  Pulse: 62  Resp: 18  Temp: 98 F (36.7 C)     Body mass index is 21.94 kg/m.    ECOG FS:0 - Asymptomatic  Sclerae unicteric, pupils round and equal Oropharynx clear and moist No cervical or supraclavicular adenopathy Lungs no rales or rhonchi Heart regular rate and rhythm Abd soft, nontender, positive bowel sounds MSK  no focal spinal tenderness, no upper extremity lymphedema Neuro: nonfocal, well oriented, appropriate affect Breasts: On the right side the breast is status post lumpectomy and radiation with no evidence of local recurrence. The cosmetic result is good. On the left side there is no finding of concern. Both axillae are benign.   LAB RESULTS:  CMP     Component Value Date/Time   NA 139 03/02/2016 1238   K 4.0 03/02/2016 1238   CO2 27 03/02/2016 1238   GLUCOSE 168 (H) 03/02/2016 1238   BUN 18.8 03/02/2016 1238   CREATININE 1.0 03/02/2016 1238   CALCIUM 9.8 03/02/2016 1238   PROT 7.3 03/02/2016 1238   ALBUMIN 4.4 03/02/2016 1238   AST 20 03/02/2016 1238   ALT 26 03/02/2016 1238   ALKPHOS 62 03/02/2016 1238   BILITOT 0.57 03/02/2016 1238    INo results found for: SPEP, UPEP  Lab Results  Component Value Date   WBC 6.4 11/02/2016   NEUTROABS 3.9 11/02/2016   HGB 14.6 11/02/2016   HCT 43.6 11/02/2016   MCV 84.6 11/02/2016   PLT 206 11/02/2016      Chemistry      Component Value Date/Time  NA 139 03/02/2016 1238   K 4.0 03/02/2016 1238   CO2 27 03/02/2016 1238   BUN 18.8 03/02/2016 1238   CREATININE 1.0 03/02/2016 1238      Component Value Date/Time   CALCIUM 9.8 03/02/2016 1238   ALKPHOS 62 03/02/2016 1238   AST 20 03/02/2016 1238   ALT 26 03/02/2016 1238   BILITOT 0.57 03/02/2016 1238       No results found for: LABCA2  No components found for: LABCA125  No results for input(s): INR in the last 168 hours.  Urinalysis No results found for: COLORURINE, APPEARANCEUR, LABSPEC, PHURINE, GLUCOSEU, HGBUR, BILIRUBINUR, KETONESUR, PROTEINUR, UROBILINOGEN, NITRITE, LEUKOCYTESUR   STUDIES: No results found.  ELIGIBLE FOR AVAILABLE RESEARCH PROTOCOL: no  ASSESSMENT: 66 y.o. Robyn Hobbs, [place] woman status post right breast upper inner quadrant biopsy 02/19/2016 for a clinical T1b N0, clinical stage IA invasive ductal carcinoma, grade 1, estrogen receptor positive,  progesterone receptor and HER-2 negative, with an MIB-1 of 2.   (1) status post right lumpectomy and sentinel lymph node sampling 05/17/2016 for a pT1c pN0, stage IA invasive ductal carcinoma, grade 1, with negative margins  (2) Oncotype DX score of 20 predicts a 10 year risk of recurrence outside the breast of 13% if the patient's only systemic treatment is tamoxifen for 5 years  (a) given the marginal predicted benefit from chemotherapy, the patient opted against that  (3) adjuvant radiation 05/18/16 - 06/08/16:  Right breast: 42.56 Gy in 16 fractions.  (4) to start tamoxifen 07/25/2016  PLAN: Edana has recovered beautifully from her surgery and radiation and is tolerating tamoxifen remarkably well. The plan will be to continue that drug for a total of 5 years.  We again reviewed the possible toxicities, side effects and complications of this agent. As the bone as she understands it will strengthen her bones and it does not "take away her estrogen" so if she wishes to use vaginal estrogen progression while on tamoxifen that is allowed.  She sees her gynecologist this month and her primary care physician in August. She will see me again in October and from that point we will start seeing her on a once a year basis.  I have put in for her mammography to be done early August, with tomography  She knows to call for any problems that may develop before her next visit   Chauncey Cruel, MD   11/02/2016 10:51 AM Medical Oncology and Hematology [facility] [place], [place] [address] Tel. [phone]    Fax. [phone]

## 2017-01-18 ENCOUNTER — Encounter: Payer: Medicare HMO | Admitting: Adult Health

## 2017-02-27 ENCOUNTER — Ambulatory Visit
Admission: RE | Admit: 2017-02-27 | Discharge: 2017-02-27 | Disposition: A | Discharge status: Home / Self Care | Payer: Medicare HMO | Source: Ambulatory Visit | Attending: Oncology | Admitting: Oncology

## 2017-02-27 DIAGNOSIS — Z170 Estrogen receptor positive status [ER+]: Principal | ICD-10-CM

## 2017-02-27 DIAGNOSIS — C50211 Malignant neoplasm of upper-inner quadrant of right female breast: Secondary | ICD-10-CM

## 2017-02-27 HISTORY — DX: Malignant neoplasm of unspecified site of unspecified female breast: C50.919

## 2017-02-27 HISTORY — DX: Personal history of irradiation: Z92.3

## 2017-04-28 DIAGNOSIS — E7800 Pure hypercholesterolemia, unspecified: Secondary | ICD-10-CM | POA: Diagnosis not present

## 2017-04-28 DIAGNOSIS — R945 Abnormal results of liver function studies: Secondary | ICD-10-CM | POA: Diagnosis not present

## 2017-05-02 ENCOUNTER — Ambulatory Visit (HOSPITAL_BASED_OUTPATIENT_CLINIC_OR_DEPARTMENT_OTHER): Payer: Medicare HMO | Admitting: Oncology

## 2017-05-02 ENCOUNTER — Telehealth: Payer: Self-pay | Admitting: Oncology

## 2017-05-02 ENCOUNTER — Other Ambulatory Visit (HOSPITAL_BASED_OUTPATIENT_CLINIC_OR_DEPARTMENT_OTHER): Payer: Medicare HMO

## 2017-05-02 VITALS — BP 131/65 | HR 68 | Temp 97.5°F | Resp 18 | Ht 64.0 in | Wt 129.0 lb

## 2017-05-02 DIAGNOSIS — Z923 Personal history of irradiation: Secondary | ICD-10-CM | POA: Diagnosis not present

## 2017-05-02 DIAGNOSIS — Z79811 Long term (current) use of aromatase inhibitors: Secondary | ICD-10-CM

## 2017-05-02 DIAGNOSIS — C50211 Malignant neoplasm of upper-inner quadrant of right female breast: Secondary | ICD-10-CM

## 2017-05-02 DIAGNOSIS — Z170 Estrogen receptor positive status [ER+]: Principal | ICD-10-CM

## 2017-05-02 LAB — CBC WITH DIFFERENTIAL/PLATELET
BASO%: 0.6 % (ref 0.0–2.0)
BASOS ABS: 0 10*3/uL (ref 0.0–0.1)
EOS%: 2.6 % (ref 0.0–7.0)
Eosinophils Absolute: 0.2 10*3/uL (ref 0.0–0.5)
HEMATOCRIT: 41.6 % (ref 34.8–46.6)
HEMOGLOBIN: 13.8 g/dL (ref 11.6–15.9)
LYMPH#: 1.8 10*3/uL (ref 0.9–3.3)
LYMPH%: 28.2 % (ref 14.0–49.7)
MCH: 28.4 pg (ref 25.1–34.0)
MCHC: 33.2 g/dL (ref 31.5–36.0)
MCV: 85.6 fL (ref 79.5–101.0)
MONO#: 0.4 10*3/uL (ref 0.1–0.9)
MONO%: 6 % (ref 0.0–14.0)
NEUT#: 4.1 10*3/uL (ref 1.5–6.5)
NEUT%: 62.6 % (ref 38.4–76.8)
PLATELETS: 173 10*3/uL (ref 145–400)
RBC: 4.86 10*6/uL (ref 3.70–5.45)
RDW: 13.5 % (ref 11.2–14.5)
WBC: 6.5 10*3/uL (ref 3.9–10.3)

## 2017-05-02 LAB — COMPREHENSIVE METABOLIC PANEL
ALBUMIN: 4.3 g/dL (ref 3.5–5.0)
ALK PHOS: 39 U/L — AB (ref 40–150)
ALT: 29 U/L (ref 0–55)
ANION GAP: 9 meq/L (ref 3–11)
AST: 26 U/L (ref 5–34)
BUN: 19 mg/dL (ref 7.0–26.0)
CALCIUM: 9.6 mg/dL (ref 8.4–10.4)
CO2: 28 mEq/L (ref 22–29)
CREATININE: 0.9 mg/dL (ref 0.6–1.1)
Chloride: 104 mEq/L (ref 98–109)
EGFR: 62 mL/min/{1.73_m2} — ABNORMAL LOW (ref >90)
Glucose: 77 mg/dl (ref 70–140)
POTASSIUM: 4.1 meq/L (ref 3.5–5.1)
Sodium: 141 mEq/L (ref 136–145)
Total Bilirubin: 0.62 mg/dL (ref 0.20–1.20)
Total Protein: 7 g/dL (ref 6.4–8.3)

## 2017-05-02 NOTE — Telephone Encounter (Signed)
Gave patient AVS and calendar of upcoming October 2019 appointments.

## 2017-05-02 NOTE — Progress Notes (Signed)
[facility]  [phone]) [phone] Fax:(336) [phone]     ID: Robyn Hobbs DOB: February 04, 1951  MR#: [id number]  JYN#:[id number]  Patient Care Team: Robyn Einstein, MD as PCP - General (Family Medicine) Robyn Kussmaul, MD as Consulting Physician (General Surgery) Robyn Hobbs, Robyn Dad, MD as Consulting Physician (Oncology) Robyn Gibson, MD as Attending Physician (Radiation Oncology) Robyn Pucker, MD (Obstetrics and Gynecology) Martinique, Amy, MD as Consulting Physician (Dermatology) OTHER MD:  CHIEF COMPLAINT: Estrogen receptor positive breast cancer  CURRENT TREATMENT: Tamoxifen  BREAST CANCER HISTORY: From the original intake note:  Robyn Hobbs had screening mammography suggesting a possible mass in the right breast and she was referred [facility] right diagnostic mammography with tomography and right breast ultrasonography was performed 02/17/2016. The breast density was category C. There was set up mass in the posterior third of the upper central right breast which was not palpable by exam. Ultrasonography confirmed an irregular hypoechoic mass measuring approximately 0.9 cm. Survey of the right axilla was unremarkable.  Biopsy of the right breast mass in question 02/19/2016 showed ([id number]) an invasive ductal carcinoma, grade 1, estrogen receptor 100% positive, with strong staining intensity, progesterone receptor negative, with an MIB-1 of 2%, and no HER-2 amplification, the signals ratio being 1.00 and the number per cell 1.05.  Her subsequent history is as detailed below.  INTERVAL HISTORY: Robyn Hobbs returns today for follow-up of her estrogen receptor positive breast cancer. She has been tolerating tamoxifen well, however, she notes a small change to her hair. Pt notes her hair has become thinner. She denies any hot flashes or vaginal wetness.   REVIEW OF SYSTEMS: Robyn Hobbs reports felling great with no overall complaints at this time. She has been mainly  walking for exercise. She had previously gotten a membership to [employer], but hasn't gone much. Notes after her vacation next week she is planning to see a trainer about three times a week. She denies unusual headaches, visual changes, nausea, vomiting, or dizziness. There has been no unusual cough, phlegm production, or pleurisy. This been no change in bowel or bladder habits. She denies unexplained fatigue or unexplained weight loss, bleeding, rash, or fever. A detailed review of systems was otherwise negative.   PAST MEDICAL HISTORY: Past Medical History:  Diagnosis Date  . Breast cancer ([facility])   . Breast cancer of upper-inner quadrant of right female breast ([facility] [place]) 02/25/2016  . History of radiation therapy 05/18/16- 06/08/16   Right Breast 42.56 Gy in 16 fractions.   . Increased liver enzymes   . Personal history of radiation therapy     PAST SURGICAL HISTORY: Past Surgical History:  Procedure Laterality Date  . BREAST BIOPSY    . BREAST LUMPECTOMY    . BREAST LUMPECTOMY WITH RADIOACTIVE SEED AND SENTINEL LYMPH NODE BIOPSY Right 03/17/2016   Procedure: BREAST LUMPECTOMY WITH RADIOACTIVE SEED AND SENTINEL LYMPH NODE BIOPSY;  Surgeon: Robyn Messing III, MD;  Location: [facility];  Service: General;  Laterality: Right;  BREAST LUMPECTOMY WITH RADIOACTIVE SEED AND SENTINEL LYMPH NODE BIOPSY  . COLONOSCOPY  multiple  . Excision of Melanoma  on back    . TONSILLECTOMY     and adenoidectomy  . VAGINAL HYSTERECTOMY  2001    FAMILY HISTORY Family History  Problem Relation Age of Onset  . Kidney disease Father   . Colon cancer Maternal Grandmother   . Colon cancer Paternal Grandfather   . Colon cancer Paternal Grandmother   The patient's  father died at the age of 56 from kidney cancer. The patient's mother died at the age of 45. The patient had one brother, no sisters. On the mother's side both the grandmother and grandfather had colon cancer diagnosed in their 93s. The patient's  brother had prostate cancer at age 33. The patient's paternal grandfather was diagnosed with lung cancer in his late 40s. There is no history of breast or ovarian cancer in the family  GYNECOLOGIC HISTORY:  No LMP recorded. Patient is postmenopausal. Menarche age 52, first live birth age 70, the patient is [place] P3. She underwent full abdominal hysterectomy with bilateral salpingo-oophorectomy June 2001. She took hormone replacement for less than a year, stopping in 2002. She used oral contraceptives for approximately 5 years remotely without complications.  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 Her husband Robyn Hobbs") used to work in [employer] but is now retired. Son Robyn Hobbs lives in [place] and is a Building services engineer for [employer], son Robyn Hobbs lives in [place] and is a serious of for Civil engineer, contracting, Robyn Hobbs Lives in [place] Where He Works in [employer]. The Patient Has 6 Grandchildren. She Attends a [employer].    ADVANCED DIRECTIVES: In place   HEALTH MAINTENANCE: Social History  Substance Use Topics  . Smoking status: Never Smoker  . Smokeless tobacco: Never Used  . Alcohol use 1.8 oz/week    3 Glasses of wine per week     Comment: she is not drinking at this time.      Colonoscopy: 2014/lobe our  PAP:  Bone density: July 2017   Allergies  Allergen Reactions  . Morphine And Related Swelling    Current Outpatient Prescriptions  Medication Sig Dispense Refill  . beta carotene w/minerals (OCUVITE) tablet Take 1 tablet by mouth daily.    . calcium carbonate (OS-CAL) 600 MG TABS Take 600 mg by mouth 2 (two) times daily with a meal.    . cholecalciferol (VITAMIN D) 1000 UNITS tablet Take 2,000 Units by mouth daily.    . Multiple Vitamin (MULTIVITAMIN) tablet Take 1 tablet by mouth daily.    . tamoxifen (NOLVADEX) 20 MG tablet   11   No current facility-administered medications for this visit.     OBJECTIVE:  Middle-aged white woman Who appears well  Vitals:   05/02/17 1513  BP: 131/65  Pulse: 68  Resp: 18  Temp: (!) 97.5 F (36.4 C)  SpO2: 100%     Body mass index is 22.14 kg/m.    ECOG FS:0 - Asymptomatic  Sclerae unicteric, EOMs intact Oropharynx clear and moist No cervical or supraclavicular adenopathy Lungs no rales or rhonchi Heart regular rate and rhythm Abd soft, nontender, positive bowel sounds MSK no focal spinal tenderness, no upper extremity lymphedema Neuro: nonfocal, well oriented, appropriate affect Breasts: The right breast is undergone lumpectomy followed by radiation. There is no evidence of local recurrence. Left breast is unremarkable. Both axillae are benign.  LAB RESULTS:  CMP     Component Value Date/Time   NA 141 05/02/2017 1500   K 4.1 05/02/2017 1500   CO2 28 05/02/2017 1500   GLUCOSE 77 05/02/2017 1500   BUN 19.0 05/02/2017 1500   CREATININE 0.9 05/02/2017 1500   CALCIUM 9.6 05/02/2017 1500   PROT 7.0 05/02/2017 1500   ALBUMIN 4.3 05/02/2017 1500   AST 26 05/02/2017 1500   ALT 29 05/02/2017 1500   ALKPHOS 39 (L) 05/02/2017 1500   BILITOT  0.62 05/02/2017 1500    INo results found for: SPEP, UPEP  Lab Results  Component Value Date   WBC 6.5 05/02/2017   NEUTROABS 4.1 05/02/2017   HGB 13.8 05/02/2017   HCT 41.6 05/02/2017   MCV 85.6 05/02/2017   PLT 173 05/02/2017      Chemistry      Component Value Date/Time   NA 141 05/02/2017 1500   K 4.1 05/02/2017 1500   CO2 28 05/02/2017 1500   BUN 19.0 05/02/2017 1500   CREATININE 0.9 05/02/2017 1500      Component Value Date/Time   CALCIUM 9.6 05/02/2017 1500   ALKPHOS 39 (L) 05/02/2017 1500   AST 26 05/02/2017 1500   ALT 29 05/02/2017 1500   BILITOT 0.62 05/02/2017 1500       No results found for: LABCA2  No components found for: [id number]  No results for input(s): INR in the last 168 hours.  Urinalysis No results found for: COLORURINE, APPEARANCEUR, LABSPEC, PHURINE,  GLUCOSEU, HGBUR, BILIRUBINUR, KETONESUR, PROTEINUR, UROBILINOGEN, NITRITE, LEUKOCYTESUR   STUDIES: Diagnostic Bilateral Mammogram with TOMO done on 02/27/2017, breast density category C, showed no architectural distortion or suspicious calcifications.  ELIGIBLE FOR AVAILABLE RESEARCH PROTOCOL: no  ASSESSMENT: 66 y.o. Robyn Hobbs, [place] woman status post right breast upper inner quadrant biopsy 02/19/2016 for a clinical T1b N0, clinical stage IA invasive ductal carcinoma, grade 1, estrogen receptor positive, progesterone receptor and HER-2 negative, with an MIB-1 of 2.   (1) status post right lumpectomy and sentinel lymph node sampling 05/17/2016 for a pT1c pN0, stage IA invasive ductal carcinoma, grade 1, with negative margins  (2) Oncotype DX score of 20 predicts a 10 year risk of recurrence outside the breast of 13% if the patient's only systemic treatment is tamoxifen for 5 years  (a) given the marginal predicted benefit from chemotherapy, the patient opted against that  (3) adjuvant radiation 05/18/16 - 06/08/16:  Right breast: 42.56 Gy in 16 fractions.  (4) started tamoxifen 07/25/2016  PLAN: Robyn Hobbs  is now a year out from definitive surgery for her breast cancer with no evidence of disease recurrence. This is favorable.  She is tolerating tamoxifen well. The plan will be to continue this for total of 5 years.  If she sees her surgeon, Dr. Marlou Starks, in April, she can return to see me in one year, October of next year. She finds that plan a tract of as she gets too different physicians to do the breast exam within the same year, which is optimal  She knows to call for any other problems that may develop before the next visit here.  Chauncey Cruel, MD 05/02/17 3:48 PM Medical Oncology and Hematology [facility] [address] [place], [place] [address] Tel. [phone] Fax. [phone]   This document serves as a record of services personally performed by Lurline Del, MD. It was created on her behalf by Margit Banda, a trained medical scribe. The creation of this record is based on the scribe's personal observations and the provider's statements to them. This document has been checked and approved by the attending provider.

## 2017-05-03 DIAGNOSIS — E7800 Pure hypercholesterolemia, unspecified: Secondary | ICD-10-CM | POA: Diagnosis not present

## 2017-05-03 DIAGNOSIS — C50211 Malignant neoplasm of upper-inner quadrant of right female breast: Secondary | ICD-10-CM | POA: Diagnosis not present

## 2017-05-03 DIAGNOSIS — K581 Irritable bowel syndrome with constipation: Secondary | ICD-10-CM | POA: Diagnosis not present

## 2017-05-03 DIAGNOSIS — M8589 Other specified disorders of bone density and structure, multiple sites: Secondary | ICD-10-CM | POA: Diagnosis not present

## 2017-05-03 DIAGNOSIS — Z0000 Encounter for general adult medical examination without abnormal findings: Secondary | ICD-10-CM | POA: Diagnosis not present

## 2017-05-03 DIAGNOSIS — R945 Abnormal results of liver function studies: Secondary | ICD-10-CM | POA: Diagnosis not present

## 2017-07-06 ENCOUNTER — Other Ambulatory Visit: Payer: Self-pay | Admitting: Oncology

## 2017-07-27 ENCOUNTER — Encounter: Payer: Self-pay | Admitting: Internal Medicine

## 2017-07-27 DIAGNOSIS — R69 Illness, unspecified: Secondary | ICD-10-CM | POA: Diagnosis not present

## 2017-09-04 DIAGNOSIS — L821 Other seborrheic keratosis: Secondary | ICD-10-CM | POA: Diagnosis not present

## 2017-09-04 DIAGNOSIS — L603 Nail dystrophy: Secondary | ICD-10-CM | POA: Diagnosis not present

## 2017-09-04 DIAGNOSIS — L570 Actinic keratosis: Secondary | ICD-10-CM | POA: Diagnosis not present

## 2017-09-04 DIAGNOSIS — D692 Other nonthrombocytopenic purpura: Secondary | ICD-10-CM | POA: Diagnosis not present

## 2017-09-04 DIAGNOSIS — L723 Sebaceous cyst: Secondary | ICD-10-CM | POA: Diagnosis not present

## 2017-09-04 DIAGNOSIS — Z85828 Personal history of other malignant neoplasm of skin: Secondary | ICD-10-CM | POA: Diagnosis not present

## 2017-09-04 DIAGNOSIS — C44619 Basal cell carcinoma of skin of left upper limb, including shoulder: Secondary | ICD-10-CM | POA: Diagnosis not present

## 2017-09-04 DIAGNOSIS — L853 Xerosis cutis: Secondary | ICD-10-CM | POA: Diagnosis not present

## 2017-09-07 ENCOUNTER — Ambulatory Visit (AMBULATORY_SURGERY_CENTER): Payer: Self-pay

## 2017-09-07 ENCOUNTER — Other Ambulatory Visit: Payer: Self-pay

## 2017-09-07 VITALS — Ht 65.0 in | Wt 133.6 lb

## 2017-09-07 DIAGNOSIS — Z800 Family history of malignant neoplasm of digestive organs: Secondary | ICD-10-CM

## 2017-09-07 NOTE — Progress Notes (Signed)
Denies allergies to eggs or soy products. Denies complication of anesthesia or sedation. Denies use of weight loss medication. Denies use of O2.   Emmi instructions declined.  

## 2017-09-08 ENCOUNTER — Encounter: Payer: Self-pay | Admitting: Internal Medicine

## 2017-09-21 ENCOUNTER — Other Ambulatory Visit: Payer: Self-pay

## 2017-09-21 ENCOUNTER — Encounter: Payer: Self-pay | Admitting: Internal Medicine

## 2017-09-21 ENCOUNTER — Ambulatory Visit (AMBULATORY_SURGERY_CENTER): Payer: Medicare HMO | Admitting: Internal Medicine

## 2017-09-21 VITALS — BP 147/67 | HR 62 | Temp 97.8°F | Resp 13 | Ht 64.0 in | Wt 129.0 lb

## 2017-09-21 DIAGNOSIS — Z1211 Encounter for screening for malignant neoplasm of colon: Secondary | ICD-10-CM | POA: Diagnosis not present

## 2017-09-21 DIAGNOSIS — Z800 Family history of malignant neoplasm of digestive organs: Secondary | ICD-10-CM | POA: Diagnosis not present

## 2017-09-21 DIAGNOSIS — D122 Benign neoplasm of ascending colon: Secondary | ICD-10-CM | POA: Diagnosis not present

## 2017-09-21 DIAGNOSIS — D123 Benign neoplasm of transverse colon: Secondary | ICD-10-CM | POA: Diagnosis not present

## 2017-09-21 MED ORDER — SODIUM CHLORIDE 0.9 % IV SOLN: 500.0000 mL | Freq: Once | INTRAVENOUS | Status: DC

## 2017-09-21 NOTE — Progress Notes (Signed)
To PACU, VSS. Report to [email or web address] 

## 2017-09-21 NOTE — Progress Notes (Signed)
Called to room to assist during endoscopic procedure.  Patient ID and intended procedure confirmed with present staff. Received instructions for my participation in the procedure from the performing physician.  

## 2017-09-21 NOTE — Op Note (Signed)
[facility] Patient Name: Robyn Hobbs Procedure Date: 09/21/2017 7:35 AM MRN: [id number] Endoscopist: Gatha Mayer , MD Age: 67 Referring MD:  Date of Birth: 1951-06-21 Gender: Female Account #: [id number] Procedure:                Colonoscopy Indications:              Colon cancer screening in patient at increased                            risk: Family history of colorectal cancer in                            multiple 2nd degree relatives Medicines:                Propofol per Anesthesia, Monitored Anesthesia Care Procedure:                Pre-Anesthesia Assessment:                           - Prior to the procedure, a History and Physical                            was performed, and patient medications and                            allergies were reviewed. The patient's tolerance of                            previous anesthesia was also reviewed. The risks                            and benefits of the procedure and the sedation                            options and risks were discussed with the patient.                            All questions were answered, and informed consent                            was obtained. Prior Anticoagulants: The patient has                            taken no previous anticoagulant or antiplatelet                            agents. ASA Grade Assessment: II - A patient with                            mild systemic disease. After reviewing the risks                            and benefits, the patient was deemed in  satisfactory condition to undergo the procedure.                           After obtaining informed consent, the colonoscope                            was passed under direct vision. Throughout the                            procedure, the patient's blood pressure, pulse, and                            oxygen saturations were monitored continuously. The                            Colonoscope  was introduced through the anus and                            advanced to the the cecum, identified by                            appendiceal orifice and ileocecal valve. The                            colonoscopy was somewhat difficult due to                            significant looping. Successful completion of the                            procedure was aided by using manual pressure. The                            patient tolerated the procedure well. The quality                            of the bowel preparation was excellent. The bowel                            preparation used was Miralax. The ileocecal valve,                            appendiceal orifice, and rectum were photographed. Scope In: 8:19:23 AM Scope Out: 8:35:53 AM Scope Withdrawal Time: 0 hours 11 minutes 51 seconds  Total Procedure Duration: 0 hours 16 minutes 30 seconds  Findings:                 The perianal examination was normal.                           The digital rectal exam findings include decreased                            sphincter tone.  A diminutive polyp was found in the transverse                            colon. The polyp was sessile. The polyp was removed                            with a cold snare. Resection and retrieval were                            complete. Verification of patient identification                            for the specimen was done. Estimated blood loss was                            minimal.                           A 1 mm polyp was found in the ascending colon. The                            polyp was sessile. The polyp was removed with a                            cold biopsy forceps. Resection and retrieval were                            complete. Verification of patient identification                            for the specimen was done. Estimated blood loss was                            minimal.                           The exam  was otherwise without abnormality on                            direct and retroflexion views. Complications:            No immediate complications. Estimated Blood Loss:     Estimated blood loss was minimal. Impression:               - Decreased sphincter tone found on digital rectal                            exam.                           - One diminutive polyp in the transverse colon,                            removed with a cold snare. Resected and retrieved.                           -  One 1 mm polyp in the ascending colon, removed                            with a cold biopsy forceps. Resected and retrieved.                           - The examination was otherwise normal on direct                            and retroflexion views.                           FHx colon ca in 3 grandparents Recommendation:           - Patient has a contact number available for                            emergencies. The signs and symptoms of potential                            delayed complications were discussed with the                            patient. Return to normal activities tomorrow.                            Written discharge instructions were provided to the                            patient.                           - Resume previous diet.                           - Continue present medications.                           - Repeat colonoscopy is recommended. The                            colonoscopy date will be determined after pathology                            results from today's exam become available for                            review.                           - Try Align for gas sxs and harder stools. Has                            tried fiber. Gatha Mayer, MD 09/21/2017 8:45:46 AM This report has been signed electronically.

## 2017-09-21 NOTE — Progress Notes (Signed)
Pt's states no medical or surgical changes since previsit or office visit. 

## 2017-09-21 NOTE — Patient Instructions (Addendum)
Today I found and removed 2 tiny polyps.  Anal sphincter is relaxed and that may be part of why gas "slips out"  No cause for different bowel movements seen.  Try taking Align probiotic x 1 month and see what that does for you and let me know if further concerns or ?'s afterward.  I will let you know pathology results and when to have another routine colonoscopy by mail and/or My Chart. Should be 5 years again.  I appreciate the opportunity to care for you. Gatha Mayer, MD, [facility]   HANDOUT GIVEN FOR POLYPS  YOU HAD AN ENDOSCOPIC PROCEDURE TODAY AT [facility]:   Refer to the procedure report that was given to you for any specific questions about what was found during the examination.  If the procedure report does not answer your questions, please call your gastroenterologist to clarify.  If you requested that your care partner not be given the details of your procedure findings, then the procedure report has been included in a sealed envelope for you to review at your convenience later.  YOU SHOULD EXPECT: Some feelings of bloating in the abdomen. Passage of more gas than usual.  Walking can help get rid of the air that was put into your GI tract during the procedure and reduce the bloating. If you had a lower endoscopy (such as a colonoscopy or flexible sigmoidoscopy) you may notice spotting of blood in your stool or on the toilet paper. If you underwent a bowel prep for your procedure, you may not have a normal bowel movement for a few days.  Please Note:  You might notice some irritation and congestion in your nose or some drainage.  This is from the oxygen used during your procedure.  There is no need for concern and it should clear up in a day or so.  SYMPTOMS TO REPORT IMMEDIATELY:   Following lower endoscopy (colonoscopy or flexible sigmoidoscopy):  Excessive amounts of blood in the stool  Significant tenderness or worsening of abdominal  pains  Swelling of the abdomen that is new, acute  Fever of 100F or higher  For urgent or emergent issues, a gastroenterologist can be reached at any hour by calling [phone].   DIET:  We do recommend a small meal at first, but then you may proceed to your regular diet.  Drink plenty of fluids but you should avoid alcoholic beverages for 24 hours.  ACTIVITY:  You should plan to take it easy for the rest of today and you should NOT DRIVE or use heavy machinery until tomorrow (because of the sedation medicines used during the test).    FOLLOW UP: Our staff will call the number listed on your records the next business day following your procedure to check on you and address any questions or concerns that you may have regarding the information given to you following your procedure. If we do not reach you, we will leave a message.  However, if you are feeling well and you are not experiencing any problems, there is no need to return our call.  We will assume that you have returned to your regular daily activities without incident.  If any biopsies were taken you will be contacted by phone or by letter within the next 1-3 weeks.  Please call us at [phone] if you have not heard about the biopsies in 3 weeks.    SIGNATURES/CONFIDENTIALITY: You and/or your care partner have signed paperwork which will  be entered into your electronic medical record.  These signatures attest to the fact that that the information above on your After Visit Summary has been reviewed and is understood.  Full responsibility of the confidentiality of this discharge information lies with you and/or your care-partner. 

## 2017-09-22 ENCOUNTER — Telehealth: Payer: Self-pay | Admitting: *Deleted

## 2017-09-22 NOTE — Telephone Encounter (Signed)
  Follow up Call-  Call back number 09/21/2017  Post procedure Call Back phone  # [phone]  Permission to leave phone message Yes  Some recent data might be hidden     Patient questions:  Do you have a fever, pain , or abdominal swelling? No. Pain Score  0 *  Have you tolerated food without any problems? Yes.    Have you been able to return to your normal activities? Yes.    Do you have any questions about your discharge instructions: Diet   No. Medications  No. Follow up visit  No.  Do you have questions or concerns about your Care? No.  Actions: * If pain score is 4 or above: No action needed, pain <4.

## 2017-09-27 ENCOUNTER — Encounter: Payer: Self-pay | Admitting: Internal Medicine

## 2017-09-27 DIAGNOSIS — Z86010 Personal history of colonic polyps: Secondary | ICD-10-CM | POA: Insufficient documentation

## 2017-09-27 NOTE — Progress Notes (Signed)
Diminutive adenoma Recall 2024

## 2017-10-09 ENCOUNTER — Other Ambulatory Visit: Payer: Self-pay | Admitting: Oncology

## 2017-10-23 DIAGNOSIS — N390 Urinary tract infection, site not specified: Secondary | ICD-10-CM | POA: Diagnosis not present

## 2017-10-23 DIAGNOSIS — R300 Dysuria: Secondary | ICD-10-CM | POA: Diagnosis not present

## 2017-10-25 DIAGNOSIS — N390 Urinary tract infection, site not specified: Secondary | ICD-10-CM | POA: Diagnosis not present

## 2017-10-30 DIAGNOSIS — C50411 Malignant neoplasm of upper-outer quadrant of right female breast: Secondary | ICD-10-CM | POA: Diagnosis not present

## 2018-01-03 DIAGNOSIS — Z01419 Encounter for gynecological examination (general) (routine) without abnormal findings: Secondary | ICD-10-CM | POA: Diagnosis not present

## 2018-01-12 ENCOUNTER — Other Ambulatory Visit: Payer: Self-pay | Admitting: Oncology

## 2018-01-19 ENCOUNTER — Other Ambulatory Visit: Payer: Self-pay | Admitting: Oncology

## 2018-01-19 DIAGNOSIS — Z853 Personal history of malignant neoplasm of breast: Secondary | ICD-10-CM

## 2018-01-30 DIAGNOSIS — R69 Illness, unspecified: Secondary | ICD-10-CM | POA: Diagnosis not present

## 2018-03-05 ENCOUNTER — Ambulatory Visit
Admission: RE | Admit: 2018-03-05 | Discharge: 2018-03-05 | Disposition: A | Discharge status: Home / Self Care | Payer: Medicare HMO | Source: Ambulatory Visit | Attending: Oncology | Admitting: Oncology

## 2018-03-05 DIAGNOSIS — R922 Inconclusive mammogram: Secondary | ICD-10-CM | POA: Diagnosis not present

## 2018-03-05 DIAGNOSIS — Z853 Personal history of malignant neoplasm of breast: Secondary | ICD-10-CM

## 2018-04-12 ENCOUNTER — Other Ambulatory Visit: Payer: Self-pay | Admitting: Oncology

## 2018-04-17 DIAGNOSIS — L609 Nail disorder, unspecified: Secondary | ICD-10-CM | POA: Diagnosis not present

## 2018-04-17 DIAGNOSIS — L821 Other seborrheic keratosis: Secondary | ICD-10-CM | POA: Diagnosis not present

## 2018-04-17 DIAGNOSIS — L603 Nail dystrophy: Secondary | ICD-10-CM | POA: Diagnosis not present

## 2018-04-17 DIAGNOSIS — L570 Actinic keratosis: Secondary | ICD-10-CM | POA: Diagnosis not present

## 2018-04-17 DIAGNOSIS — D225 Melanocytic nevi of trunk: Secondary | ICD-10-CM | POA: Diagnosis not present

## 2018-04-17 DIAGNOSIS — Z85828 Personal history of other malignant neoplasm of skin: Secondary | ICD-10-CM | POA: Diagnosis not present

## 2018-04-17 DIAGNOSIS — D485 Neoplasm of uncertain behavior of skin: Secondary | ICD-10-CM | POA: Diagnosis not present

## 2018-04-30 DIAGNOSIS — Z0000 Encounter for general adult medical examination without abnormal findings: Secondary | ICD-10-CM | POA: Diagnosis not present

## 2018-04-30 DIAGNOSIS — E7800 Pure hypercholesterolemia, unspecified: Secondary | ICD-10-CM | POA: Diagnosis not present

## 2018-05-01 NOTE — Progress Notes (Signed)
[facility]  Telephone:([phone]) [phone] Fax:(336) [phone]     ID: Robyn Hobbs DOB: July 17, 1951  MR#: [id number]  DVV#:[id number]  Patient Care Team: Karlene Einstein, MD as PCP - General (Family Medicine) Jovita Kussmaul, MD as Consulting Physician (General Surgery) Marquavis Hannen, Virgie Dad, MD as Consulting Physician (Oncology) Eppie Gibson, MD as Attending Physician (Radiation Oncology) Luellen Pucker, MD (Obstetrics and Gynecology) Martinique, Amy, MD as Consulting Physician (Dermatology) OTHER MD:  CHIEF COMPLAINT: Estrogen receptor positive breast cancer  CURRENT TREATMENT: Tamoxifen  BREAST CANCER HISTORY: From the original intake note:  Robyn Hobbs had screening mammography suggesting a possible mass in the right breast and [facility] diagnostic mammography with tomography and right breast ultrasonography was performed 02/17/2016. The breast density was category C. There was set up mass in the posterior third of the upper central right breast which was not palpable by exam. Ultrasonography confirmed an irregular hypoechoic mass measuring approximately 0.9 cm. Survey of the right axilla was unremarkable.  Biopsy of the right breast mass in question 02/19/2016 showed ([id number]) an invasive ductal carcinoma, grade 1, estrogen receptor 100% positive, with strong staining intensity, progesterone receptor negative, with an MIB-1 of 2%, and no HER-2 amplification, the signals ratio being 1.00 and the number per cell 1.05.  Her subsequent history is as detailed below.  INTERVAL HISTORY: Robyn Hobbs returns today for follow-up of her estrogen receptor positive breast cancer. She continues on tamoxifen, with good tolerance. She has hot flashes, but they are not intense enough to take medication. Se denies issues with increased vaginal discharge.   Since her last visit, she underwent diagnostic bilateral mammography with CAD and tomography on 03/05/2018 at  [facility] showing: breast density category C. There was no evidence of malignancy.    REVIEW OF SYSTEMS: Robyn Hobbs reports that she has occasional constipation. She eats fiber and fruits and vegetables. She is also taking calcium supplements. She would like to have a done density at her next mammogram in August 2020. [facility]. She also had personal training over the summer. She denies unusual headaches, visual changes, nausea, vomiting, or dizziness. There has been no unusual cough, phlegm production, or pleurisy. There has been no change in bowel or bladder habits. She denies unexplained fatigue or unexplained weight loss, bleeding, rash, or fever. A detailed review of systems was otherwise stable.    PAST MEDICAL HISTORY: Past Medical History:  Diagnosis Date  . Allergy   . Arthritis   . Breast cancer ([facility])   . Breast cancer of upper-inner quadrant of right female breast ([facility]) 02/25/2016  . Cataract   . History of radiation therapy 05/18/16- 06/08/16   Right Breast 42.56 Gy in 16 fractions.   . Increased liver enzymes   . Osteopenia   . Personal history of radiation therapy     PAST SURGICAL HISTORY: Past Surgical History:  Procedure Laterality Date  . BLADDER REPAIR    . BREAST BIOPSY    . BREAST LUMPECTOMY    . BREAST LUMPECTOMY WITH RADIOACTIVE SEED AND SENTINEL LYMPH NODE BIOPSY Right 03/17/2016   Procedure: BREAST LUMPECTOMY WITH RADIOACTIVE SEED AND SENTINEL LYMPH NODE BIOPSY;  Surgeon: Autumn Messing III, MD;  Location: [facility];  Service: General;  Laterality: Right;  BREAST LUMPECTOMY WITH RADIOACTIVE SEED AND SENTINEL LYMPH NODE BIOPSY  . COLONOSCOPY  multiple  . Excision of Melanoma  on back    .  TONSILLECTOMY     and adenoidectomy  . VAGINAL HYSTERECTOMY  2001    FAMILY HISTORY Family History  Problem Relation Age of Onset  . Kidney disease Father   . Colon cancer Maternal Grandmother   . Colon cancer  Paternal Grandfather   . Colon cancer Paternal Grandmother   . Esophageal cancer Neg Hx   . Liver cancer Neg Hx   . Pancreatic cancer Neg Hx   . Rectal cancer Neg Hx   . Stomach cancer Neg Hx   The patient's father died at the age of 48 from kidney cancer. The patient's mother died at the age of 83. The patient had one brother, no sisters. On the mother's side both the grandmother and grandfather had colon cancer diagnosed in their 60s. The patient's brother had prostate cancer at age 54. The patient's paternal grandfather was diagnosed with lung cancer in his late 35s. There is no history of breast or ovarian cancer in the family  GYNECOLOGIC HISTORY:  No LMP recorded. Patient is postmenopausal. Menarche age 49, first live birth age 45, the patient is [place] P3. She underwent full abdominal hysterectomy with bilateral salpingo-oophorectomy June 2001. She took hormone replacement for less than a year, stopping in 2002. She used oral contraceptives for approximately 5 years remotely without complications.  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 Her husband Reynolds Bowl") used to work in [employer] but is now retired. Son [name] lives in [place] and is a Hydrologist for [employer], son [name] lives in [place] and runs [employer] that imports [employer] technologist, [place] Lives in [place] Where He Works in [employer]. The Patient Has 6 Grandchildren. She Attends a [employer].    ADVANCED DIRECTIVES: In place   HEALTH MAINTENANCE: Social History   Tobacco Use  . Smoking status: Never Smoker  . Smokeless tobacco: Never Used  Substance Use Topics  . Alcohol use: Yes    Alcohol/week: 3.0 standard drinks    Types: 3 Glasses of wine per week    Comment: she is not drinking at this time.   . Drug use: No     Colonoscopy: 2014/ Dr. Carlean Purl  PAP:  Bone density: July 2017   Allergies  Allergen Reactions  . Morphine And Related  Swelling    Current Outpatient Medications  Medication Sig Dispense Refill  . beta carotene w/minerals (OCUVITE) tablet Take 1 tablet by mouth daily.    . calcium carbonate (OS-CAL) 600 MG TABS Take 600 mg by mouth 2 (two) times daily with a meal.    . cholecalciferol (VITAMIN D) 1000 UNITS tablet Take 2,000 Units by mouth daily.    . Multiple Vitamin (MULTIVITAMIN) tablet Take 1 tablet by mouth daily.    . tamoxifen (NOLVADEX) 20 MG tablet TAKE 1 TABLET(20 MG) BY MOUTH DAILY 90 tablet 0   Current Facility-Administered Medications  Medication Dose Route Frequency Provider Last Rate Last Dose  . 0.9 %  sodium chloride infusion  500 mL Intravenous Once Gatha Mayer, MD        OBJECTIVE: Middle-aged white woman in no acute distress  Vitals:   05/02/18 1535  BP: 112/65  Pulse: (!) 58  Resp: 18  Temp: 97.6 F (36.4 C)  SpO2: 100%     Body mass index is 22.02 kg/m.    ECOG FS:0 - Asymptomatic  Sclerae unicteric, pupils round and equal No cervical or supraclavicular adenopathy Lungs no rales or rhonchi Heart  regular rate and rhythm Abd soft, nontender, positive bowel sounds MSK no focal spinal tenderness, no upper extremity lymphedema Neuro: nonfocal, well oriented, appropriate affect Breasts: The right breast is status post lumpectomy and radiation.  There is no evidence of local recurrence.  The left breast is benign.  Both axillae are benign.  LAB RESULTS:  CMP     Component Value Date/Time   NA 141 05/02/2017 1500   K 4.1 05/02/2017 1500   CO2 28 05/02/2017 1500   GLUCOSE 77 05/02/2017 1500   BUN 19.0 05/02/2017 1500   CREATININE 0.9 05/02/2017 1500   CALCIUM 9.6 05/02/2017 1500   PROT 7.0 05/02/2017 1500   ALBUMIN 4.3 05/02/2017 1500   AST 26 05/02/2017 1500   ALT 29 05/02/2017 1500   ALKPHOS 39 (L) 05/02/2017 1500   BILITOT 0.62 05/02/2017 1500    INo results found for: SPEP, UPEP  Lab Results  Component Value Date   WBC 5.5 05/02/2018   NEUTROABS 3.2  05/02/2018   HGB 13.9 05/02/2018   HCT 42.4 05/02/2018   MCV 86.7 05/02/2018   PLT 181 05/02/2018      Chemistry      Component Value Date/Time   NA 141 05/02/2017 1500   K 4.1 05/02/2017 1500   CO2 28 05/02/2017 1500   BUN 19.0 05/02/2017 1500   CREATININE 0.9 05/02/2017 1500      Component Value Date/Time   CALCIUM 9.6 05/02/2017 1500   ALKPHOS 39 (L) 05/02/2017 1500   AST 26 05/02/2017 1500   ALT 29 05/02/2017 1500   BILITOT 0.62 05/02/2017 1500       No results found for: LABCA2  No components found for: UJWJX914  No results for input(s): INR in the last 168 hours.  Urinalysis No results found for: COLORURINE, APPEARANCEUR, LABSPEC, PHURINE, GLUCOSEU, HGBUR, BILIRUBINUR, KETONESUR, PROTEINUR, UROBILINOGEN, NITRITE, LEUKOCYTESUR   STUDIES: Diagnostic bilateral mammography with CAD and tomography on 03/05/2018 at [facility] showing: breast density category C. There was no evidence of malignancy.    ELIGIBLE FOR AVAILABLE RESEARCH PROTOCOL: no  ASSESSMENT: 67 y.o. Robyn Hobbs, [place] woman status post right breast upper inner quadrant biopsy 02/19/2016 for a clinical T1b N0, clinical stage IA invasive ductal carcinoma, grade 1, estrogen receptor positive, progesterone receptor and HER-2 negative, with an MIB-1 of 2.   (1) status post right lumpectomy and sentinel lymph node sampling 05/17/2016 for a pT1c pN0, stage IA invasive ductal carcinoma, grade 1, with negative margins  (2) Oncotype DX score of 20 predicts a 10 year risk of recurrence outside the breast of 13% if the patient's only systemic treatment is tamoxifen for 5 years  (a) given the marginal predicted benefit from chemotherapy, the patient opted against that  (3) adjuvant radiation 05/18/16 - 06/08/16:  Right breast: 42.56 Gy in 16 fractions.  (4) started tamoxifen 07/25/2016  PLAN: Vaniah is now 2 years out from definitive surgery for breast cancer with no evidence of disease recurrence.  This is  very favorable.  She is tolerating tamoxifen well and the plan will be to continue that for total of 5 years.  She is behind on her DEXA scan testing and I will add that to her mammography next August at the [facility].  I think she can go off the calcium and see if that helps the problem with constipation.  Usually there is enough calcium in the diet so that if she has a good vitamin D level she will absorb enough to  keep her bones in good health  Otherwise she will see me again in a year.  She knows to call for any issues that may develop before the next visit. Aren Cherne, Virgie Dad, MD  05/02/18 3:46 PM Medical Oncology and Hematology [facility] [address] [place], [place] [address] Tel. [phone]    Fax. [phone]  Alice Rieger, am acting as scribe for Chauncey Cruel MD.  I, Lurline Del MD, have reviewed the above documentation for accuracy and completeness, and I agree with the above.

## 2018-05-02 ENCOUNTER — Telehealth: Payer: Self-pay

## 2018-05-02 ENCOUNTER — Inpatient Hospital Stay: Payer: Medicare HMO | Attending: Oncology

## 2018-05-02 ENCOUNTER — Inpatient Hospital Stay (HOSPITAL_BASED_OUTPATIENT_CLINIC_OR_DEPARTMENT_OTHER): Payer: Medicare HMO | Admitting: Oncology

## 2018-05-02 VITALS — BP 112/65 | HR 58 | Temp 97.6°F | Resp 18 | Ht 64.0 in | Wt 128.3 lb

## 2018-05-02 DIAGNOSIS — Z923 Personal history of irradiation: Secondary | ICD-10-CM | POA: Insufficient documentation

## 2018-05-02 DIAGNOSIS — M8580 Other specified disorders of bone density and structure, unspecified site: Secondary | ICD-10-CM

## 2018-05-02 DIAGNOSIS — Z170 Estrogen receptor positive status [ER+]: Secondary | ICD-10-CM

## 2018-05-02 DIAGNOSIS — Z90722 Acquired absence of ovaries, bilateral: Secondary | ICD-10-CM | POA: Insufficient documentation

## 2018-05-02 DIAGNOSIS — Z800 Family history of malignant neoplasm of digestive organs: Secondary | ICD-10-CM | POA: Insufficient documentation

## 2018-05-02 DIAGNOSIS — Z79810 Long term (current) use of selective estrogen receptor modulators (SERMs): Secondary | ICD-10-CM

## 2018-05-02 DIAGNOSIS — Z90710 Acquired absence of both cervix and uterus: Secondary | ICD-10-CM

## 2018-05-02 DIAGNOSIS — C50211 Malignant neoplasm of upper-inner quadrant of right female breast: Secondary | ICD-10-CM

## 2018-05-02 DIAGNOSIS — Z79899 Other long term (current) drug therapy: Secondary | ICD-10-CM | POA: Insufficient documentation

## 2018-05-02 DIAGNOSIS — Z9079 Acquired absence of other genital organ(s): Secondary | ICD-10-CM

## 2018-05-02 LAB — CBC WITH DIFFERENTIAL/PLATELET
Abs Immature Granulocytes: 0.01 10*3/uL (ref 0.00–0.07)
BASOS PCT: 1 %
Basophils Absolute: 0 10*3/uL (ref 0.0–0.1)
EOS ABS: 0.1 10*3/uL (ref 0.0–0.5)
EOS PCT: 3 %
HCT: 42.4 % (ref 36.0–46.0)
Hemoglobin: 13.9 g/dL (ref 12.0–15.0)
Immature Granulocytes: 0 %
Lymphocytes Relative: 29 %
Lymphs Abs: 1.6 10*3/uL (ref 0.7–4.0)
MCH: 28.4 pg (ref 26.0–34.0)
MCHC: 32.8 g/dL (ref 30.0–36.0)
MCV: 86.7 fL (ref 80.0–100.0)
MONOS PCT: 8 %
Monocytes Absolute: 0.5 10*3/uL (ref 0.1–1.0)
NEUTROS PCT: 59 %
Neutro Abs: 3.2 10*3/uL (ref 1.7–7.7)
PLATELETS: 181 10*3/uL (ref 150–400)
RBC: 4.89 MIL/uL (ref 3.87–5.11)
RDW: 12.7 % (ref 11.5–15.5)
WBC: 5.5 10*3/uL (ref 4.0–10.5)
nRBC: 0 % (ref 0.0–0.2)

## 2018-05-02 LAB — COMPREHENSIVE METABOLIC PANEL
ALT: 25 U/L (ref 0–44)
ANION GAP: 8 (ref 5–15)
AST: 23 U/L (ref 15–41)
Albumin: 4.2 g/dL (ref 3.5–5.0)
Alkaline Phosphatase: 36 U/L — ABNORMAL LOW (ref 38–126)
BILIRUBIN TOTAL: 0.4 mg/dL (ref 0.3–1.2)
BUN: 16 mg/dL (ref 8–23)
CALCIUM: 9.8 mg/dL (ref 8.9–10.3)
CO2: 28 mmol/L (ref 22–32)
Chloride: 105 mmol/L (ref 98–111)
Creatinine, Ser: 1.11 mg/dL — ABNORMAL HIGH (ref 0.44–1.00)
GFR calc non Af Amer: 49 mL/min — ABNORMAL LOW (ref >60)
GFR, EST AFRICAN AMERICAN: 57 mL/min — AB (ref >60)
GLUCOSE: 96 mg/dL (ref 70–99)
Potassium: 4.3 mmol/L (ref 3.5–5.1)
Sodium: 141 mmol/L (ref 135–145)
TOTAL PROTEIN: 6.9 g/dL (ref 6.5–8.1)

## 2018-05-02 NOTE — Telephone Encounter (Signed)
error 

## 2018-05-02 NOTE — Telephone Encounter (Signed)
Printed avs and calender of upcoming appointment. Per 10/9 los 

## 2018-05-04 DIAGNOSIS — E7800 Pure hypercholesterolemia, unspecified: Secondary | ICD-10-CM | POA: Diagnosis not present

## 2018-05-04 DIAGNOSIS — Z0000 Encounter for general adult medical examination without abnormal findings: Secondary | ICD-10-CM | POA: Diagnosis not present

## 2018-05-04 DIAGNOSIS — M8589 Other specified disorders of bone density and structure, multiple sites: Secondary | ICD-10-CM | POA: Diagnosis not present

## 2018-05-04 DIAGNOSIS — K581 Irritable bowel syndrome with constipation: Secondary | ICD-10-CM | POA: Diagnosis not present

## 2018-05-04 DIAGNOSIS — C50211 Malignant neoplasm of upper-inner quadrant of right female breast: Secondary | ICD-10-CM | POA: Diagnosis not present

## 2018-05-04 DIAGNOSIS — Z23 Encounter for immunization: Secondary | ICD-10-CM | POA: Diagnosis not present

## 2018-05-24 ENCOUNTER — Telehealth: Payer: Self-pay | Admitting: Oncology

## 2018-05-24 NOTE — Telephone Encounter (Signed)
Patient called to reschedule  °

## 2018-05-31 DIAGNOSIS — Z85828 Personal history of other malignant neoplasm of skin: Secondary | ICD-10-CM | POA: Diagnosis not present

## 2018-05-31 DIAGNOSIS — L988 Other specified disorders of the skin and subcutaneous tissue: Secondary | ICD-10-CM | POA: Diagnosis not present

## 2018-05-31 DIAGNOSIS — D485 Neoplasm of uncertain behavior of skin: Secondary | ICD-10-CM | POA: Diagnosis not present

## 2018-06-01 DIAGNOSIS — R8290 Unspecified abnormal findings in urine: Secondary | ICD-10-CM | POA: Diagnosis not present

## 2018-06-01 DIAGNOSIS — N390 Urinary tract infection, site not specified: Secondary | ICD-10-CM | POA: Diagnosis not present

## 2018-07-04 DIAGNOSIS — H353132 Nonexudative age-related macular degeneration, bilateral, intermediate dry stage: Secondary | ICD-10-CM | POA: Diagnosis not present

## 2018-07-04 DIAGNOSIS — H25013 Cortical age-related cataract, bilateral: Secondary | ICD-10-CM | POA: Diagnosis not present

## 2018-07-04 DIAGNOSIS — H524 Presbyopia: Secondary | ICD-10-CM | POA: Diagnosis not present

## 2018-07-04 DIAGNOSIS — H04123 Dry eye syndrome of bilateral lacrimal glands: Secondary | ICD-10-CM | POA: Diagnosis not present

## 2018-07-07 ENCOUNTER — Other Ambulatory Visit: Payer: Self-pay | Admitting: Oncology

## 2018-09-25 DIAGNOSIS — L918 Other hypertrophic disorders of the skin: Secondary | ICD-10-CM | POA: Diagnosis not present

## 2018-09-25 DIAGNOSIS — L905 Scar conditions and fibrosis of skin: Secondary | ICD-10-CM | POA: Diagnosis not present

## 2018-09-25 DIAGNOSIS — L603 Nail dystrophy: Secondary | ICD-10-CM | POA: Diagnosis not present

## 2018-09-25 DIAGNOSIS — L570 Actinic keratosis: Secondary | ICD-10-CM | POA: Diagnosis not present

## 2018-09-25 DIAGNOSIS — D485 Neoplasm of uncertain behavior of skin: Secondary | ICD-10-CM | POA: Diagnosis not present

## 2018-09-25 DIAGNOSIS — D0359 Melanoma in situ of other part of trunk: Secondary | ICD-10-CM | POA: Diagnosis not present

## 2018-09-25 DIAGNOSIS — L821 Other seborrheic keratosis: Secondary | ICD-10-CM | POA: Diagnosis not present

## 2018-09-25 DIAGNOSIS — L853 Xerosis cutis: Secondary | ICD-10-CM | POA: Diagnosis not present

## 2018-09-25 DIAGNOSIS — L700 Acne vulgaris: Secondary | ICD-10-CM | POA: Diagnosis not present

## 2018-09-25 DIAGNOSIS — Z85828 Personal history of other malignant neoplasm of skin: Secondary | ICD-10-CM | POA: Diagnosis not present

## 2018-09-26 ENCOUNTER — Ambulatory Visit
Admission: RE | Admit: 2018-09-26 | Discharge: 2018-09-26 | Disposition: A | Discharge status: Home / Self Care | Payer: Medicare HMO | Source: Ambulatory Visit | Attending: Oncology | Admitting: Oncology

## 2018-09-26 ENCOUNTER — Telehealth: Payer: Self-pay

## 2018-09-26 DIAGNOSIS — Z170 Estrogen receptor positive status [ER+]: Principal | ICD-10-CM

## 2018-09-26 DIAGNOSIS — M8580 Other specified disorders of bone density and structure, unspecified site: Secondary | ICD-10-CM

## 2018-09-26 DIAGNOSIS — Z780 Asymptomatic menopausal state: Secondary | ICD-10-CM | POA: Diagnosis not present

## 2018-09-26 DIAGNOSIS — C50211 Malignant neoplasm of upper-inner quadrant of right female breast: Secondary | ICD-10-CM

## 2018-09-26 DIAGNOSIS — M85852 Other specified disorders of bone density and structure, left thigh: Secondary | ICD-10-CM | POA: Diagnosis not present

## 2018-09-26 NOTE — Telephone Encounter (Signed)
-----   Message from Gardenia Phlegm, NP sent at 09/26/2018 10:28 AM EST ----- Please call patient and let her know that her bone density was normal. ----- Message ----- From: Interface, Rad Results In Sent: 09/26/2018   8:18 AM EST To: Chauncey Cruel, MD

## 2018-09-26 NOTE — Telephone Encounter (Signed)
Spoke with patient to inform of normal BD results.  Patient voiced understanding and thanks for call.  Understands to call center if any new concerns arise.

## 2018-10-06 ENCOUNTER — Other Ambulatory Visit: Payer: Self-pay | Admitting: Oncology

## 2018-10-08 DIAGNOSIS — Z85828 Personal history of other malignant neoplasm of skin: Secondary | ICD-10-CM | POA: Diagnosis not present

## 2018-10-08 DIAGNOSIS — D0359 Melanoma in situ of other part of trunk: Secondary | ICD-10-CM | POA: Diagnosis not present

## 2019-01-03 ENCOUNTER — Other Ambulatory Visit: Payer: Self-pay | Admitting: Oncology

## 2019-01-08 DIAGNOSIS — L821 Other seborrheic keratosis: Secondary | ICD-10-CM | POA: Diagnosis not present

## 2019-01-08 DIAGNOSIS — Z85820 Personal history of malignant melanoma of skin: Secondary | ICD-10-CM | POA: Diagnosis not present

## 2019-01-08 DIAGNOSIS — Z85828 Personal history of other malignant neoplasm of skin: Secondary | ICD-10-CM | POA: Diagnosis not present

## 2019-01-08 DIAGNOSIS — L700 Acne vulgaris: Secondary | ICD-10-CM | POA: Diagnosis not present

## 2019-01-08 DIAGNOSIS — L570 Actinic keratosis: Secondary | ICD-10-CM | POA: Diagnosis not present

## 2019-02-14 DIAGNOSIS — R69 Illness, unspecified: Secondary | ICD-10-CM | POA: Diagnosis not present

## 2019-02-22 DIAGNOSIS — Z20828 Contact with and (suspected) exposure to other viral communicable diseases: Secondary | ICD-10-CM | POA: Diagnosis not present

## 2019-02-26 ENCOUNTER — Other Ambulatory Visit: Payer: Self-pay | Admitting: *Deleted

## 2019-02-26 DIAGNOSIS — Z1231 Encounter for screening mammogram for malignant neoplasm of breast: Secondary | ICD-10-CM

## 2019-03-05 ENCOUNTER — Ambulatory Visit: Payer: Medicare HMO | Admitting: Internal Medicine

## 2019-03-05 ENCOUNTER — Encounter: Payer: Self-pay | Admitting: Internal Medicine

## 2019-03-05 ENCOUNTER — Other Ambulatory Visit: Payer: Self-pay

## 2019-03-05 VITALS — BP 128/70 | HR 98 | Temp 97.8°F | Ht 64.0 in | Wt 125.0 lb

## 2019-03-05 DIAGNOSIS — R143 Flatulence: Secondary | ICD-10-CM | POA: Diagnosis not present

## 2019-03-05 DIAGNOSIS — K582 Mixed irritable bowel syndrome: Secondary | ICD-10-CM

## 2019-03-05 DIAGNOSIS — K6289 Other specified diseases of anus and rectum: Secondary | ICD-10-CM

## 2019-03-05 NOTE — Patient Instructions (Signed)
You have been given a testing kit to check for small intestine bacterial overgrowth (SIBO) which is completed by a company named [facility]. Make sure to return your test in the mail using the return mailing label given you along with the kit. Your demographic and insurance information have already been sent to the company and they should be in contact with you over the next week regarding this test. Please keep in mind that you will be getting a call from phone number [phone] or a similar number. If you do not hear from them within this time frame, please call our office at [phone].    We are referring you for pelvic floor physical therapy. They will contact you about a date/time.   Dr Carlean Purl mentioned to you today that depending on the SIBO results he may recommend a FODMAP diet.   I appreciate the opportunity to care for you. Robyn Rusk, MD, [facility]

## 2019-03-05 NOTE — Progress Notes (Signed)
Robyn Hobbs 68 y.o. 1950/07/30 [id number]  Assessment & Plan:   Encounter Diagnoses  Name Primary?  Marland Kitchen Anal sphincter incompetence Yes  . Irritable bowel syndrome with both constipation and diarrhea   . Flatulence     I think it is quite possible she could have small intestinal bacterial overgrowth.  We will do the test and see.  Treat if positive.  Refer for pelvic floor physical therapy to see if we can help with her anal sphincter incompetence  I appreciate the opportunity to care for this patient. CC: Haimes, Youlanda Roys, MD     Subjective:   Chief Complaint: Flatulence, leakage of gas, constipation and diarrhea  HPI The patient is here to discuss bowel habit problems.  In February she had a lot of gas and diarrhea that was explosive.  Things improved.  However she still has a fair amount of gas and flatulence and it may leak out without her knowledge which is embarrassing.  She is taking align for period of time without much difference.  She is avoiding dairy and gluten but cannot see any significant triggers.  She has been eating less due to the flatulence and gas and now sometimes constipated.  Weight is only down about 4 to 5 pounds.  This is a significant quality of life issue for her.  She does not have any genitourinary symptoms though she had a bladder suspension surgery 2 to 3 years ago to fix that.  When I did her colonoscopy last year I noted a weak anal sphincter.  She had a 1 mm adenoma at that time.  She is also tried fiber supplementation and has not helped. Allergies  Allergen Reactions  . Morphine And Related Swelling   Current Meds  Medication Sig  . beta carotene w/minerals (OCUVITE) tablet Take 1 tablet by mouth daily.  . cholecalciferol (VITAMIN D) 1000 UNITS tablet Take 2,000 Units by mouth daily.  . Cranberry 1000 MG CAPS Take 1 capsule by mouth daily.  . tamoxifen (NOLVADEX) 20 MG tablet TAKE 1 TABLET(20 MG) BY MOUTH DAILY   Past Medical History:   Diagnosis Date  . Allergy   . Arthritis   . Breast cancer ([place])   . 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 Cataract   . History of radiation therapy 05/18/16- 06/08/16   Right Breast 42.56 Gy in 16 fractions.   . Increased liver enzymes   . Osteopenia   . Personal history of radiation therapy    Past Surgical History:  Procedure Laterality Date  . BLADDER REPAIR    . BREAST BIOPSY    . BREAST LUMPECTOMY    . BREAST LUMPECTOMY WITH RADIOACTIVE SEED AND SENTINEL LYMPH NODE BIOPSY Right 03/17/2016   Procedure: BREAST LUMPECTOMY WITH RADIOACTIVE SEED AND SENTINEL LYMPH NODE BIOPSY;  Surgeon: Autumn Messing III, MD;  Location: [place];  Service: General;  Laterality: Right;  BREAST LUMPECTOMY WITH RADIOACTIVE SEED AND SENTINEL LYMPH NODE BIOPSY  . COLONOSCOPY  multiple  . Excision of Melanoma  on back    . TONSILLECTOMY     and adenoidectomy  . VAGINAL HYSTERECTOMY  2001   Social History   Social History Narrative   Married to Chance   never smoker, rare alcohol no drug use   family history includes Colon cancer in her maternal grandmother, paternal grandfather, and paternal grandmother; Kidney disease in her father.   Review of Systems As per HPI  Objective:   Physical Exam BP 128/70   Pulse 98   Temp 97.8 F (36.6 C) (Oral)   Ht 5\' 4"  (1.626 m)   Wt 125 lb (56.7 kg)   BMI 21.46 kg/m  No acute distress Eyes anicteric Alert and oriented x3 Appropriate mood and affect

## 2019-03-08 ENCOUNTER — Encounter: Payer: Self-pay | Admitting: Internal Medicine

## 2019-03-10 ENCOUNTER — Encounter: Payer: Self-pay | Admitting: Internal Medicine

## 2019-03-10 DIAGNOSIS — R143 Flatulence: Secondary | ICD-10-CM | POA: Diagnosis not present

## 2019-03-10 DIAGNOSIS — K582 Mixed irritable bowel syndrome: Secondary | ICD-10-CM | POA: Diagnosis not present

## 2019-03-12 ENCOUNTER — Ambulatory Visit: Payer: Medicare HMO | Admitting: Physical Therapy

## 2019-03-14 ENCOUNTER — Ambulatory Visit: Payer: Medicare HMO | Attending: Internal Medicine | Admitting: Physical Therapy

## 2019-03-14 ENCOUNTER — Encounter: Payer: Self-pay | Admitting: Physical Therapy

## 2019-03-14 ENCOUNTER — Other Ambulatory Visit: Payer: Self-pay

## 2019-03-14 DIAGNOSIS — K6289 Other specified diseases of anus and rectum: Secondary | ICD-10-CM | POA: Insufficient documentation

## 2019-03-14 DIAGNOSIS — M6281 Muscle weakness (generalized): Secondary | ICD-10-CM | POA: Insufficient documentation

## 2019-03-14 DIAGNOSIS — R278 Other lack of coordination: Secondary | ICD-10-CM | POA: Diagnosis not present

## 2019-03-14 NOTE — Therapy (Signed)
[facility] [address], [place], [place], [address] Phone: [phone]   Fax:  [phone]  Physical Therapy Evaluation  Patient Details  Name: Robyn Hobbs MRN: [id number] Date of Birth: 1950/12/17 Referring Provider (PT): Dr. Silvano Rusk   Encounter Date: 03/14/2019  PT End of Session - 03/14/19 0842    Visit Number  1    Date for PT Re-Evaluation  05/09/19    Authorization Type  aetna medicare    PT Start Time  0800    PT Stop Time  0841    PT Time Calculation (min)  41 min    Activity Tolerance  Patient tolerated treatment well    Behavior During Therapy  [facility] for tasks assessed/performed       Past Medical History:  Diagnosis Date  . Allergy   . Arthritis   . Breast cancer ([place])   . 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 Cataract   . History of radiation therapy 05/18/16- 06/08/16   Right Breast 42.56 Gy in 16 fractions.   . Increased liver enzymes   . Osteopenia   . Personal history of radiation therapy     Past Surgical History:  Procedure Laterality Date  . BLADDER REPAIR    . BREAST BIOPSY    . BREAST LUMPECTOMY    . BREAST LUMPECTOMY WITH RADIOACTIVE SEED AND SENTINEL LYMPH NODE BIOPSY Right 03/17/2016   Procedure: BREAST LUMPECTOMY WITH RADIOACTIVE SEED AND SENTINEL LYMPH NODE BIOPSY;  Surgeon: Autumn Messing III, MD;  Location: [facility];  Service: General;  Laterality: Right;  BREAST LUMPECTOMY WITH RADIOACTIVE SEED AND SENTINEL LYMPH NODE BIOPSY  . COLONOSCOPY  multiple  . Excision of Melanoma  on back    . TONSILLECTOMY     and adenoidectomy  . VAGINAL HYSTERECTOMY  2001    There were no vitals filed for this visit.   Subjective Assessment - 03/14/19 0802    Subjective  MD said I had weak pelvic floor muscles. Patient had a bladder tack several years ago. Since 08/2018 had small bowel movement during the day. Patient had a  colonoscopy in 08/2017. Patient tried Humphrey Rolls was better for awhile. Patient started to have increased gas and explosive diarrhea since 08/2018. Patient now  has a BM in the morning. She can tell she is going to have a bowel movement and has to rush to the commode. Patient does not go out due to gas.Occasionally will leak stool going to the bathroom.    Patient Stated Goals  control the muscles to reduce the gas and get to the commode in time    Currently in Pain?  No/denies    Multiple Pain Sites  No         [facility] PT Assessment - 03/14/19 0001      Assessment   Medical Diagnosis  K62.89 Anal sphincter incompetence; K58.2 Irritable bowel syndrome with both constipation and diarrhea    Referring Provider (PT)  Dr. Silvano Rusk    Onset Date/Surgical Date  08/25/18    Prior Therapy  none      Precautions   Precautions  Other (comment)    Precaution Comments  breast cancer      Restrictions   Weight Bearing Restrictions  No      Balance Screen   Has the patient fallen in the past 6 months  No    Has the patient had a decrease  in activity level because of a fear of falling?   No    Is the patient reluctant to leave their home because of a fear of falling?   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    Pelvic Floor Special Questions - 03/14/19  0001    Prior Pregnancies  Yes    Number of Pregnancies  3    Number of Vaginal Deliveries  3    Urinary Leakage  No    Urinary frequency  stool type is 5 or 6    Fecal incontinence  Yes   occasionally while walking to the commode   Caffeine beverages  coffee, water    Pelvic Floor Internal Exam  Patient confirms identification and approves PT to assess pelvic floor and treatment    Exam Type  Rectal    Palpation  internal sphincter 1/5 with decreased anterior contraction; tightness in the anterior wall of the anus and rectum    Strength  weak squeeze, no lift   external sphincter, puborectalis   Tone  low               PT Education - 03/14/19 0841    Education Details  how to fill out a bowel diary; pelvic floor contraction in sidely    Person(s) Educated  Patient    Methods  Explanation;Demonstration;Verbal cues;Handout    Comprehension  Returned demonstration;Verbalized understanding       PT Short Term Goals - 03/14/19 0856      PT SHORT TERM GOAL #1   Title  independent with initial HEP    Time  4    Period  Weeks    Status  New    Target Date  04/11/19      PT SHORT TERM GOAL #2   Title  able to control gas >/= 25% of the time due to improved pelvic floor strength    Time  4    Period  Weeks    Status  New    Target Date  04/11/19      PT SHORT TERM GOAL #3   Title  education on how to bulk up her stool to reduce watery stool    Time  4    Period  Weeks    Status  New    Target Date  04/11/19        PT Long Term Goals - 03/14/19 0858      PT LONG TERM GOAL #1   Title  independent with HEP and understand how to progress herself    Time  8    Period  Weeks    Status  New    Target Date  05/09/19      PT LONG TERM GOAL #2   Title  stool more of Type 4 than than Type 5 or 6 due to increased bulking of stool    Time  8    Period  Weeks    Status  New    Target Date  05/09/19      PT LONG TERM GOAL #3   Title  anal sphincter strength >/=  3/5 with circular contraction due to improve tissue mobility of the anterior sphincter    Time  8    Period  Weeks    Status  New    Target Date  05/09/19      PT LONG TERM GOAL #4   Title  gas during the day improved >/= 75% due to improve sphincter control    Time  8    Period  Weeks    Status  New    Target Date  05/09/19      PT LONG TERM GOAL #5   Title  when she has the urge to have a stool movement she is able to walk slowly to the commode without stool leakage due to improved sphincter control    Time  8    Period  Weeks    Status  New    Target Date  05/09/19             Plan - 03/14/19 0843    Clinical Impression Statement  Patient is a 68 year old female with weak anal sphinter and increased gas that makes it difficult for her to socialize. Patient reports since 08/25/2018 she has had increased gas and will leak stool on the way to the commode when she has the urge. Stool Type is 5 or 6. Anal extenal sphincter strength is 2/5, internal sphincter strength is 1/5 and  puborectalis is 2/5. Patient has tightness in the anterior rectum area. Patient has low tone of the anal sphincter. Patient will wear a pad on occasion due to stool leakage. Patient has decreased mobility of diaphragm. Patient has weakness on bilateral hip abductors. Patient has not been eating if she is going out due to her not wanting to have gas. Patient will benefit from skilled therapy to improve strength and coordinaiton of the pelvic floor muscles to reduce gas and stool leakage.    Personal Factors and Comorbidities  Age;Comorbidity 1;Comorbidity 2    Comorbidities  s/p breast cancer; bladder tack    Examination-Activity Limitations  Continence;Toileting    Examination-Participation Restrictions  Community Activity;Shop;Interpersonal Relationship    Stability/Clinical Decision Making  Evolving/Moderate complexity    Clinical Decision Making  Moderate    Rehab Potential  Excellent    PT Frequency  1x /  week    PT Duration  8 weeks    PT Treatment/Interventions  Biofeedback;Therapeutic activities;Therapeutic exercise;Patient/family education;Neuromuscular re-education;Manual techniques;Dry needling    PT Next Visit Plan  look over stool diary; review diet, hip abduction strength; anal sphincter strength; abdominal massage; soft tissue work to diaphragm and diaphragmatic breathing    Consulted and Agree with Plan of Care  Patient       Patient will benefit from skilled therapeutic intervention in order to improve the following deficits and impairments:  Decreased coordination, Increased fascial restricitons, Decreased activity tolerance, Decreased strength  Visit Diagnosis: 1. Muscle weakness (generalized)   2. Other lack  of coordination   3. Anal sphincter incompetence        Problem List Patient Active Problem List   Diagnosis Date Noted  . Hx of adenomatous polyp of colon 09/27/2017  . Malignant neoplasm of upper-inner quadrant of right breast in female, estrogen receptor positive ([place]) 02/25/2016  . Irritable bowel syndrome with constipation 01/01/2016  . Osteopenia 12/31/2015    Earlie Counts, PT 03/14/19 9:06 AM   [facility] [address] [place], [place], [address] Phone: [phone]   Fax:  [phone]  Name: RYLEE HUESTIS MRN: [id number] Date of Birth: 1951/03/15

## 2019-03-14 NOTE — Patient Instructions (Addendum)
Slow Contraction: Gravity Eliminated (Side-Lying)    Lie on left side, hips and knees slightly bent. Slowly squeeze pelvic floor for _5__ seconds. Rest for _5__ seconds. Repeat _10__ times. Do _3__ times a day. End with 5 quick flicks  Copyright  VHI. All rights reserved.  Fill out 2 days of bowel diary  [facility] [address] [place], [place] [address] Phone # [phone] Fax [phone]

## 2019-03-20 ENCOUNTER — Other Ambulatory Visit: Payer: Self-pay | Admitting: Oncology

## 2019-03-20 DIAGNOSIS — Z853 Personal history of malignant neoplasm of breast: Secondary | ICD-10-CM

## 2019-03-21 ENCOUNTER — Telehealth: Payer: Self-pay | Admitting: Internal Medicine

## 2019-03-21 ENCOUNTER — Encounter: Payer: Self-pay | Admitting: Internal Medicine

## 2019-03-21 DIAGNOSIS — K638219 Small intestinal bacterial overgrowth, unspecified: Secondary | ICD-10-CM

## 2019-03-21 DIAGNOSIS — K6389 Other specified diseases of intestine: Secondary | ICD-10-CM

## 2019-03-21 HISTORY — DX: Small intestinal bacterial overgrowth, unspecified: K63.8219

## 2019-03-21 HISTORY — DX: Other specified diseases of intestine: K63.89

## 2019-03-21 MED ORDER — NEOMYCIN SULFATE 500 MG PO TABS: 500.0000 mg | ORAL_TABLET | Freq: Two times a day (BID) | ORAL | 0 refills | Status: DC

## 2019-03-21 MED ORDER — AMOXICILLIN-POT CLAVULANATE 875-125 MG PO TABS: 1.0000 | ORAL_TABLET | Freq: Two times a day (BID) | ORAL | 0 refills | Status: AC

## 2019-03-21 NOTE — Telephone Encounter (Signed)
Patient notified to D/C the neomycin.  She will call back for additional questions or concerns.

## 2019-03-21 NOTE — Telephone Encounter (Signed)
Please tell patient her breath test was + for SIBO  Plan: Augmentin 875 mg bid x 10 days Neomycin 500 mg bid x 10 days  I sent the Rxs  F/U me 6 weeks

## 2019-03-21 NOTE — Telephone Encounter (Signed)
Your thoughts?

## 2019-03-21 NOTE — Telephone Encounter (Signed)
Patient notified  She will call back and schedule follow up

## 2019-03-21 NOTE — Telephone Encounter (Signed)
Let's go without that one then

## 2019-03-22 ENCOUNTER — Other Ambulatory Visit: Payer: Self-pay

## 2019-03-22 ENCOUNTER — Ambulatory Visit
Admission: RE | Admit: 2019-03-22 | Discharge: 2019-03-22 | Disposition: A | Discharge status: Home / Self Care | Payer: Medicare HMO | Source: Ambulatory Visit | Attending: Oncology | Admitting: Oncology

## 2019-03-22 DIAGNOSIS — Z853 Personal history of malignant neoplasm of breast: Secondary | ICD-10-CM

## 2019-03-22 DIAGNOSIS — R922 Inconclusive mammogram: Secondary | ICD-10-CM | POA: Diagnosis not present

## 2019-03-27 ENCOUNTER — Encounter: Payer: Medicare HMO | Admitting: Physical Therapy

## 2019-03-28 DIAGNOSIS — C44729 Squamous cell carcinoma of skin of left lower limb, including hip: Secondary | ICD-10-CM | POA: Diagnosis not present

## 2019-03-28 DIAGNOSIS — D485 Neoplasm of uncertain behavior of skin: Secondary | ICD-10-CM | POA: Diagnosis not present

## 2019-03-31 ENCOUNTER — Other Ambulatory Visit: Payer: Self-pay | Admitting: Oncology

## 2019-04-04 ENCOUNTER — Ambulatory Visit: Payer: Medicare HMO | Attending: Internal Medicine | Admitting: Physical Therapy

## 2019-04-04 ENCOUNTER — Encounter: Payer: Self-pay | Admitting: Physical Therapy

## 2019-04-04 ENCOUNTER — Other Ambulatory Visit: Payer: Self-pay

## 2019-04-04 DIAGNOSIS — M6281 Muscle weakness (generalized): Secondary | ICD-10-CM | POA: Insufficient documentation

## 2019-04-04 DIAGNOSIS — K6289 Other specified diseases of anus and rectum: Secondary | ICD-10-CM | POA: Insufficient documentation

## 2019-04-04 DIAGNOSIS — R278 Other lack of coordination: Secondary | ICD-10-CM | POA: Insufficient documentation

## 2019-04-04 NOTE — Therapy (Signed)
[facility] [address], [place], [place], [address] Phone: [phone]   Fax:  [phone]  Physical Therapy Treatment  Patient Details  Name: Robyn Hobbs MRN: [id number] Date of Birth: 02-Apr-1951 Referring Provider (PT): Dr. Silvano Rusk   Encounter Date: 04/04/2019  PT End of Session - 04/04/19 0859    Visit Number  2    Date for PT Re-Evaluation  05/09/19    Authorization Type  aetna medicare    PT Start Time  0845    PT Stop Time  0925    PT Time Calculation (min)  40 min    Activity Tolerance  Patient tolerated treatment well    Behavior During Therapy  [facility] for tasks assessed/performed       Past Medical History:  Diagnosis Date  . Allergy   . Arthritis   . Breast cancer ([place])   . 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 Cataract   . History of radiation therapy 05/18/16- 06/08/16   Right Breast 42.56 Gy in 16 fractions.   . Increased liver enzymes   . Osteopenia   . Personal history of radiation therapy   . 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 BLADDER REPAIR    . BREAST BIOPSY    . BREAST LUMPECTOMY    . BREAST LUMPECTOMY WITH RADIOACTIVE SEED AND SENTINEL LYMPH NODE BIOPSY Right 03/17/2016   Procedure: BREAST LUMPECTOMY WITH RADIOACTIVE SEED AND SENTINEL LYMPH NODE BIOPSY;  Surgeon: Autumn Messing III, MD;  Location: [place];  Service: General;  Laterality: Right;  BREAST LUMPECTOMY WITH RADIOACTIVE SEED AND SENTINEL LYMPH NODE BIOPSY  . COLONOSCOPY  multiple  . Excision of Melanoma  on back    . TONSILLECTOMY     and adenoidectomy  . VAGINAL HYSTERECTOMY  2001    There were no vitals filed for this visit.  Subjective Assessment - 04/04/19 0851    Subjective  My stool is Type 4 now. I was out of  town last week. My small intestine bacteria test came back positive. I am to take antibiotics  for 10 days and it improved my bowels. I am going everyday and it is normal. Patient gas is better. I  am trying to control the gas. Sometimes I will burp instead of passing gas. No stool leakage.    Patient Stated Goals  control the muscles to reduce the gas and get to the commode in time    Currently in Pain?  No/denies                       [facility] Adult PT Treatment/Exercise - 04/04/19 0001      Self-Care   Self-Care  Other Self-Care Comments    Other Self-Care Comments   looked over bowel diary and looked good.       Lumbar Exercises: Seated   Other Seated Lumbar Exercises  contract pelvic floor 5 seconds 40H then 5 quick flicks    Other Seated Lumbar Exercises  diaphragmatic breathing in supine and sitting      Lumbar Exercises: Sidelying   Hip Abduction  Right;Left;10 reps;1 second   with pelvic floor contraction     Manual Therapy   Manual Therapy  Soft tissue mobilization;Myofascial release  Soft tissue mobilization  bilateral diaphragm, around the abdominal muscles to improve mobility    Myofascial Release  the upper abdomen to promote stools             PT Education - 04/04/19 0930    Education Details  pelvic floor contraction in sitting, hip abduction in sidely, diaphragmatic breathing    Person(s) Educated  Patient    Methods  Explanation;Demonstration;Verbal cues;Handout    Comprehension  Verbalized understanding;Returned demonstration       PT Short Term Goals - 04/04/19 0856      PT SHORT TERM GOAL #1   Title  independent with initial HEP    Time  4    Period  Weeks    Status  Achieved    Target Date  04/11/19      PT SHORT TERM GOAL #2   Title  able to control gas >/= 25% of the time due to improved pelvic floor strength    Time  4    Period  Weeks    Status  Achieved      PT SHORT TERM GOAL #3   Title  education on how to bulk up her  stool to reduce watery stool    Time  4    Period  Weeks    Status  Achieved      PT SHORT TERM GOAL #5   Status  Achieved        PT Long Term Goals - 04/04/19 0857      PT LONG TERM GOAL #5   Title  when she has the urge to have a stool movement she is able to walk slowly to the commode without stool leakage due to improved sphincter control    Time  8    Period  Weeks    Status  Achieved            Plan - 04/04/19 0850    Clinical Impression Statement  Patient had small intestine bacteria and took antibiotics. Patient is now having a Type 4 bowel movement daily. Patient has no fecal leakage. Patient is able to control her gas better. Patient has met her STG. Patient understands how to progress her exercises. Patient will be seen after she sees Dr. Carlean Purl due to doing so well. Patient will benefit from skilled therapy to progress her pelvic floor strength.    Personal Factors and Comorbidities  Age;Comorbidity 1;Comorbidity 2    Comorbidities  s/p breast cancer; bladder tack    Examination-Activity Limitations  Transfers    Examination-Participation Restrictions  Community Activity;Shop;Interpersonal Relationship    Stability/Clinical Decision Making  Evolving/Moderate complexity    Rehab Potential  Excellent    PT Frequency  1x / week    PT Duration  8 weeks    PT Treatment/Interventions  Biofeedback;Therapeutic activities;Therapeutic exercise;Patient/family education;Neuromuscular re-education;Manual techniques;Dry needling    PT Next Visit Plan  see what Dr. Carlean Purl says; progress pelvic floor strength    Recommended Other Services  MD signed intial eval    Consulted and Agree with Plan of Care  Patient       Patient will benefit from skilled therapeutic intervention in order to improve the following deficits and impairments:  Decreased coordination, Increased fascial restricitons, Decreased activity tolerance, Decreased strength  Visit Diagnosis: Muscle weakness  (generalized)  Other lack of coordination  Anal sphincter incompetence     Problem List Patient Active Problem List   Diagnosis Date Noted  . Small intestinal bacterial  overgrowth 03/21/2019  . Hx of adenomatous polyp of colon 09/27/2017  . Malignant neoplasm of upper-inner quadrant of right breast in female, estrogen receptor positive ([place]) 02/25/2016  . Irritable bowel syndrome with constipation 01/01/2016  . Osteopenia 12/31/2015    Earlie Counts, PT 04/04/19 9:36 AM   [facility] [address] [place], [place], [address] Phone: [phone]   Fax:  [phone]  Name: Robyn Hobbs MRN: [id number] Date of Birth: 1951-01-15

## 2019-04-04 NOTE — Patient Instructions (Addendum)
Hook-Lying    Lie with hips and knees bent. Allow body's muscles to relax. Place hands on belly. Inhale slowly and deeply for _3__ seconds, so hands move up. Then take _3__ seconds to exhale. Repeat _5__ times. Do _1__ times a day.   Copyright  VHI. All rights reserved.   Sitting    Sit comfortably. Allow body's muscles to relax. Place hands on belly. Inhale slowly and deeply for _3__ seconds, so hands move out. Then take __3_ seconds to exhale. Repeat 5___ times. Do _1__ times a day.  Copyright  VHI. All rights reserved.  Slow Contraction: Gravity Resisted (Sitting)    Sitting, slowly squeeze pelvic floor for _5__ seconds. Rest for __5_ seconds. Repeat __10_ times. Do __3_ times a day. End with 5 quick flicks In 2 weeks contract for 8 seconds.  In 4 weeks contract for 10 seconds Copyright  VHI. All rights reserved.  Slow Contraction: Gravity Eliminated (Side-Lying)    Lie on left side, hips and knees slightly bent. Slowly squeeze pelvic floor for _10__ seconds. Rest for _10__ seconds. Repeat _5__ times. Do _2__ times a day. In 2 weeks increase to 15 seconds In 4 weeks increase to 20 seconds  Copyright  VHI. All rights reserved.  HIP: Abduction - Side-Lying    Lie on side, legs straight and in line with trunk. Squeeze glutes. Raise top leg up and slightly back. Point toes forward. _10__ reps per set, _1__ sets per day, __1_ days per week Bend bottom leg to stabilize pelvis. Both legs Copyright  VHI. All rights reserved.  [facility] [address], [place],  [address] Phone # [phone] Fax [phone]

## 2019-04-10 ENCOUNTER — Encounter: Payer: Medicare HMO | Admitting: Physical Therapy

## 2019-04-15 ENCOUNTER — Other Ambulatory Visit: Payer: Self-pay | Admitting: Oncology

## 2019-04-18 ENCOUNTER — Encounter: Payer: Medicare HMO | Admitting: Physical Therapy

## 2019-04-25 ENCOUNTER — Telehealth: Payer: Self-pay | Admitting: Oncology

## 2019-04-25 NOTE — Telephone Encounter (Signed)
GM PAL 10/19 moved appointment to 10/23. Left message. Schedule mailed.

## 2019-04-26 DIAGNOSIS — L814 Other melanin hyperpigmentation: Secondary | ICD-10-CM | POA: Diagnosis not present

## 2019-04-26 DIAGNOSIS — L570 Actinic keratosis: Secondary | ICD-10-CM | POA: Diagnosis not present

## 2019-04-26 DIAGNOSIS — Z85828 Personal history of other malignant neoplasm of skin: Secondary | ICD-10-CM | POA: Diagnosis not present

## 2019-04-26 DIAGNOSIS — L821 Other seborrheic keratosis: Secondary | ICD-10-CM | POA: Diagnosis not present

## 2019-04-26 DIAGNOSIS — L738 Other specified follicular disorders: Secondary | ICD-10-CM | POA: Diagnosis not present

## 2019-04-26 DIAGNOSIS — C44729 Squamous cell carcinoma of skin of left lower limb, including hip: Secondary | ICD-10-CM | POA: Diagnosis not present

## 2019-05-01 ENCOUNTER — Ambulatory Visit: Payer: Medicare HMO | Admitting: Oncology

## 2019-05-01 ENCOUNTER — Other Ambulatory Visit: Payer: Medicare HMO

## 2019-05-02 ENCOUNTER — Other Ambulatory Visit: Payer: Medicare HMO

## 2019-05-02 ENCOUNTER — Ambulatory Visit: Payer: Medicare HMO | Admitting: Oncology

## 2019-05-02 DIAGNOSIS — C44729 Squamous cell carcinoma of skin of left lower limb, including hip: Secondary | ICD-10-CM | POA: Diagnosis not present

## 2019-05-02 DIAGNOSIS — Z85828 Personal history of other malignant neoplasm of skin: Secondary | ICD-10-CM | POA: Diagnosis not present

## 2019-05-03 ENCOUNTER — Other Ambulatory Visit (INDEPENDENT_AMBULATORY_CARE_PROVIDER_SITE_OTHER): Payer: Medicare HMO

## 2019-05-03 ENCOUNTER — Other Ambulatory Visit: Payer: Self-pay

## 2019-05-03 ENCOUNTER — Ambulatory Visit: Payer: Medicare HMO | Admitting: Internal Medicine

## 2019-05-03 ENCOUNTER — Encounter: Payer: Self-pay | Admitting: Internal Medicine

## 2019-05-03 VITALS — BP 100/62 | HR 72 | Temp 97.7°F | Ht 65.0 in | Wt 124.0 lb

## 2019-05-03 DIAGNOSIS — R195 Other fecal abnormalities: Secondary | ICD-10-CM

## 2019-05-03 DIAGNOSIS — E7800 Pure hypercholesterolemia, unspecified: Secondary | ICD-10-CM | POA: Diagnosis not present

## 2019-05-03 DIAGNOSIS — R143 Flatulence: Secondary | ICD-10-CM | POA: Diagnosis not present

## 2019-05-03 DIAGNOSIS — K6289 Other specified diseases of anus and rectum: Secondary | ICD-10-CM

## 2019-05-03 DIAGNOSIS — K6389 Other specified diseases of intestine: Secondary | ICD-10-CM | POA: Diagnosis not present

## 2019-05-03 DIAGNOSIS — Z0000 Encounter for general adult medical examination without abnormal findings: Secondary | ICD-10-CM | POA: Diagnosis not present

## 2019-05-03 LAB — IGA: IgA: 50 mg/dL — ABNORMAL LOW (ref 68–378)

## 2019-05-03 NOTE — Progress Notes (Signed)
Robyn Hobbs 68 y.o. 1951/05/05 [id number]  Assessment & Plan:   Encounter Diagnoses  Name Primary?  . Loose stools Yes  . Flatulence   . Small intestinal bacterial overgrowth   . Anal sphincter incompetence    Will check celiac antibodies with TTG antibody and IgA level.  Maybe she has celiac disease we had not look for that before and it is worthwhile to know.  If that is negative will prescribe Xifaxan hopefully that will be affordable.  Will retreat small intestinal bacterial overgrowth.  She will continue with physical therapy  She will see me again in approximately 6 weeks  She had asked me what she supposed to look for and I explained that the goal is to reduce to and eliminate loose stools and flatulence by treating small intestinal bacterial overgrowth.  In conjunction with physical therapy hopeful that we will end her problems with unexpected leakage of gas.  I appreciate the opportunity to care for this patient. CC: Robyn Hobbs, Robyn Roys, MD   Subjective:   Chief Complaint: Follow-up of IBS  HPI Robyn Hobbs is here for follow-up, she tested positive for small intestinal bacterial overgrowth.  I wanted to treat her with Augmentin plus neomycin but she was concerned about the potential of hearing loss with neomycin and she already has some.  At any rate she might have a little bit less diarrhea but still feels bloated and gassy and has flatulence.  She has started [facility] physical therapy and has maybe 1 session left and thinks that has helped some with her defecation issues.  She wants to continue to push for resolution of her symptoms.  Again mentions that she is not been able to figure out any dietary triggers. Allergies  Allergen Reactions  . Morphine And Related Swelling   Current Meds  Medication Sig  . beta carotene w/minerals (OCUVITE) tablet Take 1 tablet by mouth daily.  . cholecalciferol (VITAMIN D) 1000 UNITS tablet Take 2,000 Units by mouth daily.  .  Cranberry 1000 MG CAPS Take 1 capsule by mouth daily.  Marland Kitchen doxycycline (VIBRAMYCIN) 100 MG capsule Take 100 mg by mouth 2 (two) times daily.  . tamoxifen (NOLVADEX) 20 MG tablet TAKE 1 TABLET(20 MG) BY MOUTH DAILY   Past Medical History:  Diagnosis Date  . Allergy   . Arthritis   . Breast cancer ([place])   . Breast cancer of upper-inner quadrant of right female breast ([place]) 02/25/2016  . Cataract   . History of radiation therapy 05/18/16- 06/08/16   Right Breast 42.56 Gy in 16 fractions.   . Increased liver enzymes   . Osteopenia   . Personal history of radiation therapy   . Small intestinal bacterial overgrowth 03/21/2019   44 ppm increase H2, 13 ppm methane 57 ppm total lactulose breath test    Past Surgical History:  Procedure Laterality Date  . BLADDER REPAIR    . BREAST BIOPSY    . BREAST LUMPECTOMY    . BREAST LUMPECTOMY WITH RADIOACTIVE SEED AND SENTINEL LYMPH NODE BIOPSY Right 03/17/2016   Procedure: BREAST LUMPECTOMY WITH RADIOACTIVE SEED AND SENTINEL LYMPH NODE BIOPSY;  Surgeon: Autumn Messing III, MD;  Location: [facility];  Service: General;  Laterality: Right;  BREAST LUMPECTOMY WITH RADIOACTIVE SEED AND SENTINEL LYMPH NODE BIOPSY  . COLONOSCOPY  multiple  . Excision of Melanoma  on back    . TONSILLECTOMY     and adenoidectomy  . VAGINAL HYSTERECTOMY  2001   Social  History   Social History Narrative   Married to [place]   never smoker, rare alcohol no drug use   family history includes Colon cancer in her maternal grandmother, paternal grandfather, and paternal grandmother; Kidney disease in her father.   Review of Systems As above  Objective:   Physical Exam BP 100/62 (BP Location: Left Arm, Patient Position: Sitting, Cuff Size: Normal)   Pulse 72   Temp 97.7 F (36.5 C) (Oral)   Ht 5\' 5"  (1.651 m)   Wt 124 lb (56.2 kg)   BMI 20.63 kg/m   15 minutes time spent with patient > half in counseling coordination of care

## 2019-05-03 NOTE — Patient Instructions (Signed)
Your provider has requested that you go to the basement level for lab work before leaving today. Press "B" on the elevator. The lab is located at the first door on the left as you exit the elevator.   Follow up with Dr Carlean Purl in 6 weeks please.    I appreciate the opportunity to care for you. Silvano Rusk, MD, [facility]

## 2019-05-04 DIAGNOSIS — K6289 Other specified diseases of anus and rectum: Secondary | ICD-10-CM | POA: Insufficient documentation

## 2019-05-04 DIAGNOSIS — R143 Flatulence: Secondary | ICD-10-CM | POA: Insufficient documentation

## 2019-05-04 DIAGNOSIS — R195 Other fecal abnormalities: Secondary | ICD-10-CM | POA: Insufficient documentation

## 2019-05-04 HISTORY — DX: Other specified diseases of anus and rectum: K62.89

## 2019-05-06 LAB — TISSUE TRANSGLUTAMINASE, IGA: (tTG) Ab, IgA: 1 U/mL

## 2019-05-07 ENCOUNTER — Ambulatory Visit: Payer: Medicare HMO | Attending: Internal Medicine | Admitting: Physical Therapy

## 2019-05-07 ENCOUNTER — Encounter: Payer: Self-pay | Admitting: Physical Therapy

## 2019-05-07 ENCOUNTER — Other Ambulatory Visit: Payer: Self-pay

## 2019-05-07 DIAGNOSIS — M6281 Muscle weakness (generalized): Secondary | ICD-10-CM | POA: Insufficient documentation

## 2019-05-07 DIAGNOSIS — K6289 Other specified diseases of anus and rectum: Secondary | ICD-10-CM

## 2019-05-07 DIAGNOSIS — R278 Other lack of coordination: Secondary | ICD-10-CM | POA: Diagnosis not present

## 2019-05-07 NOTE — Patient Instructions (Addendum)
About Abdominal Massage  Abdominal massage, also called external colon massage, is a self-treatment circular massage technique that can reduce and eliminate gas and ease constipation. The colon naturally contracts in waves in a clockwise direction starting from inside the right hip, moving up toward the ribs, across the belly, and down inside the left hip.  When you perform circular abdominal massage, you help stimulate your colon's normal wave pattern of movement called peristalsis.  It is most beneficial when done after eating.  Positioning You can practice abdominal massage with oil while lying down, or in the shower with soap.  Some people find that it is just as effective to do the massage through clothing while sitting or standing.  How to Massage Start by placing your finger tips or knuckles on your right side, just inside your hip bone.  . Make small circular movements while you move upward toward your rib cage.   . Once you reach the bottom right side of your rib cage, take your circular movements across to the left side of the bottom of your rib cage.  . Next, move downward until you reach the inside of your left hip bone.  This is the path your feces travel in your colon. . Continue to perform your abdominal massage in this pattern for 10 minutes each day.     You can apply as much pressure as is comfortable in your massage.  Start gently and build pressure as you continue to practice.  Notice any areas of pain as you massage; areas of slight pain may be relieved as you massage, but if you have areas of significant or intense pain, consult with your healthcare provider.  Other Considerations . General physical activity including bending and stretching can have a beneficial massage-like effect on the colon.  Deep breathing can also stimulate the colon because breathing deeply activates the same nervous system that supplies the colon.   . Abdominal massage should always be used in  combination with a bowel-conscious diet that is high in the proper type of fiber for you, fluids (primarily water), and a regular exercise program. [facility] [address], [address] [place], [place] [address] Phone # [phone] Fax [phone]  

## 2019-05-07 NOTE — Therapy (Signed)
[facility] [address] [place], [place], [address] Phone: [phone]   Fax:  [phone]  Physical Therapy Treatment  Patient Details  Name: Robyn Hobbs MRN: [id number] Date of Birth: [birth year] Referring Provider (PT): Dr. Silvano Rusk   Encounter Date: 05/07/2019  PT End of Session - 05/07/19 0859    Visit Number  3    Date for PT Re-Evaluation  07/02/19    Authorization Type  aetna medicare    PT Start Time  0845    PT Stop Time  0927    PT Time Calculation (min)  42 min    Activity Tolerance  Patient tolerated treatment well    Behavior During Therapy  [facility] for tasks assessed/performed       Past Medical History:  Diagnosis Date  . Allergy   . Arthritis   . Breast cancer ([place])   . 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 Cataract   . History of radiation therapy 05/18/16- 06/08/16   Right Breast 42.56 Gy in 16 fractions.   . Increased liver enzymes   . Osteopenia   . Personal history of radiation therapy   . 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 BLADDER REPAIR    . BREAST BIOPSY    . BREAST LUMPECTOMY    . BREAST LUMPECTOMY WITH RADIOACTIVE SEED AND SENTINEL LYMPH NODE BIOPSY Right 03/17/2016   Procedure: BREAST LUMPECTOMY WITH RADIOACTIVE SEED AND SENTINEL LYMPH NODE BIOPSY;  Surgeon: Autumn Messing III, MD;  Location: [facility];  Service: General;  Laterality: Right;  BREAST LUMPECTOMY WITH RADIOACTIVE SEED AND SENTINEL LYMPH NODE BIOPSY  . COLONOSCOPY  multiple  . Excision of Melanoma  on back    . TONSILLECTOMY     and adenoidectomy  . VAGINAL HYSTERECTOMY  2001    There were no vitals filed for this visit.  Subjective Assessment - 05/07/19 0848    Subjective  Nothing has improved. I am sent for a  celiac disease test. I have bacterial overgrowth. Patient reports when she eats she gets bloated. I am going to the bathroom. Patient goes to the bathroom daily and it is very loose. Patient has lots of gas with no control and prevents me from going anywhere. Patient has some fecal leakage 1 time per week due to loose stool.    Patient Stated Goals  control the muscles to reduce the gas and get to the commode in time    Currently in Pain?  No/denies    Multiple Pain Sites  No         [facility] PT Assessment - 05/07/19 0001      Assessment   Medical Diagnosis  K62.89 Anal sphincter incompetence; K58.2 Irritable bowel syndrome with both constipation and diarrhea    Referring Provider (PT)  Dr. 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    Leisure  3 times per week worksout; walks daily      Cognition   Overall Cognitive Status  Within Functional Limits for tasks assessed      Strength   Right Hip ABduction  3+/5    Left Hip ABduction  3+/5      Palpation   SI assessment   right ilium is rotated anteriorly    Palpation comment  tightness in the diaphragm,                 Pelvic Floor Special Questions - 05/07/19 0001    Pelvic Floor Internal Exam  Patient confirms identification and approves PT to assess pelvic floor and treatment    Exam Type  Rectal    Strength  weak squeeze, no lift        [facility] Adult PT Treatment/Exercise - 05/07/19 0001      Self-Care   Self-Care  Other Self-Care Comments    Other Self-Care Comments   instructed patient on how to fill out a bowel and bladder diary to see what foods  affect the bowels      Neuro Re-ed    Neuro Re-ed Details   using therapist finger in the anal canal to assist in pelvic floor contraction and bring the anterior wall forward and upward      Lumbar Exercises: Sidelying   Hip Abduction  Right;Left;10 reps;1 second   with pelvic floor contraction     Manual Therapy   Manual Therapy  Soft tissue mobilization;Internal Pelvic Floor;Myofascial release    Soft tissue mobilization  circular massage to promote peristalic motion of the large intestines to reduce bloating and  bowel movements    Myofascial Release  the upper abdomen to promote stools    Internal Pelvic Floor  anterior anal wall soft tissue work to reduce the restrictions for a circular contraction             PT Education - 05/07/19 0927    Education Details  abdominal massage; filling out the bowel diary    Person(s) Educated  Patient    Methods  Explanation;Demonstration;Handout    Comprehension  Verbalized understanding;Returned demonstration       PT Short Term Goals - 04/04/19 0856      PT SHORT TERM GOAL #1   Title  independent with initial HEP    Time  4    Period  Weeks    Status  Achieved    Target Date  04/11/19      PT SHORT TERM GOAL #2   Title  able to control gas >/= 25% of the time due to improved pelvic floor strength    Time  4    Period  Weeks    Status  Achieved      PT SHORT TERM GOAL #3   Title  education on how to bulk up her stool to reduce watery stool    Time  4    Period  Weeks    Status  Achieved      PT SHORT TERM GOAL #5   Status  Achieved        PT Long Term Goals - 05/07/19 0932      PT LONG TERM GOAL #1   Title  independent with HEP and understand how to progress herself    Time  8    Period  Weeks  Status  On-going      PT LONG TERM GOAL #2   Title  stool more of Type 4 than than Type 5 or 6 due to increased bulking of stool    Time  8    Period  Weeks    Status  On-going      PT LONG TERM GOAL #3    Title  anal sphincter strength >/= 3/5 with circular contraction due to improve tissue mobility of the anterior sphincter    Time  8    Period  Weeks    Status  On-going      PT LONG TERM GOAL #4   Title  gas during the day improved >/= 75% due to improve sphincter control    Time  8    Period  Weeks    Status  On-going      PT LONG TERM GOAL #5   Title  when she has the urge to have a stool movement she is able to walk slowly to the commode without stool leakage due to improved sphincter control    Time  8    Period  Weeks    Status  Achieved            Plan - 05/07/19 0900    Clinical Impression Statement  Patient is able to have a bowel movement daily but it is loose. Patient does not go out due to the gas and stool leakage. Pelvic floor strength is 2/5 with difficulty contracting the anterior wall of the anus and lifting. Patient continues to have some hip weakness. Patient was educated on abdominal massage to reduce bloating and assist in bowel movements. Patient will benefi tfrom skilled therapy to progress her pelvic floor strength.    Personal Factors and Comorbidities  Age;Comorbidity 1;Comorbidity 2    Comorbidities  s/p breast cancer; bladder tack    Examination-Activity Limitations  Transfers    Examination-Participation Restrictions  Community Activity;Shop;Interpersonal Relationship    Stability/Clinical Decision Making  Evolving/Moderate complexity    Rehab Potential  Excellent    PT Frequency  1x / week    PT Duration  8 weeks    PT Treatment/Interventions  Biofeedback;Therapeutic activities;Therapeutic exercise;Patient/family education;Neuromuscular re-education;Manual techniques;Dry needling    PT Next Visit Plan  progress pelvic floor strength and perform soft tissue work on the anterior wall    Recommended Other Services  sent MD renewal note    Consulted and Agree with Plan of Care  Patient       Patient will benefit from skilled therapeutic intervention  in order to improve the following deficits and impairments:  Decreased coordination, Increased fascial restricitons, Decreased activity tolerance, Decreased strength  Visit Diagnosis: Muscle weakness (generalized) - Plan: PT plan of care cert/re-cert  Other lack of coordination - Plan: PT plan of care cert/re-cert  Anal sphincter incompetence - Plan: PT plan of care cert/re-cert     Problem List Patient Active Problem List   Diagnosis Date Noted  . 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 Flatulence 05/04/2019  . Loose stools 05/04/2019  . Small intestinal bacterial overgrowth 03/21/2019  . Hx of adenomatous polyp of colon 09/27/2017  . Malignant neoplasm of upper-inner quadrant of right breast in female, estrogen receptor positive ([place]) 02/25/2016  . Irritable bowel syndrome with constipation 01/01/2016  . Osteopenia 12/31/2015    Earlie Counts, PT 05/07/19 9:37 AM   [facility] [address], [place], [place], [address] Phone:  [phone]   Fax:  [phone]  Name: Robyn Hobbs MRN: [id number] Date of Birth: Dec 01, 1950

## 2019-05-08 ENCOUNTER — Telehealth: Payer: Self-pay | Admitting: Internal Medicine

## 2019-05-08 NOTE — Telephone Encounter (Signed)
Patient advised that Dr. Carlean Purl will need to review the results and we will call back once he has reviewed and came up with a plan.

## 2019-05-09 DIAGNOSIS — Z0000 Encounter for general adult medical examination without abnormal findings: Secondary | ICD-10-CM | POA: Diagnosis not present

## 2019-05-09 DIAGNOSIS — E7800 Pure hypercholesterolemia, unspecified: Secondary | ICD-10-CM | POA: Diagnosis not present

## 2019-05-09 DIAGNOSIS — C50911 Malignant neoplasm of unspecified site of right female breast: Secondary | ICD-10-CM | POA: Diagnosis not present

## 2019-05-09 DIAGNOSIS — M8589 Other specified disorders of bone density and structure, multiple sites: Secondary | ICD-10-CM | POA: Diagnosis not present

## 2019-05-09 DIAGNOSIS — K581 Irritable bowel syndrome with constipation: Secondary | ICD-10-CM | POA: Diagnosis not present

## 2019-05-09 DIAGNOSIS — C50211 Malignant neoplasm of upper-inner quadrant of right female breast: Secondary | ICD-10-CM | POA: Diagnosis not present

## 2019-05-13 ENCOUNTER — Ambulatory Visit: Payer: Medicare HMO | Admitting: Oncology

## 2019-05-13 ENCOUNTER — Other Ambulatory Visit: Payer: Medicare HMO

## 2019-05-16 ENCOUNTER — Telehealth: Payer: Self-pay | Admitting: Internal Medicine

## 2019-05-16 NOTE — Progress Notes (Signed)
[facility]  [phone] [phone]     ID: Robyn Hobbs DOB: 17-Jan-1951  MR#: [id number]  IBB#:[id number]  Patient Care Team: Karlene Einstein, MD as PCP - General (Family Medicine) Jovita Kussmaul, MD as Consulting Physician (General Surgery) Brendaliz Kuk, Virgie Dad, MD as Consulting Physician (Oncology) Eppie Gibson, MD as Attending Physician (Radiation Oncology) Martinique, Amy, MD as Consulting Physician (Dermatology) Gatha Mayer, MD as Consulting Physician (Gastroenterology) OTHER MD:  CHIEF COMPLAINT: Estrogen receptor positive breast cancer  CURRENT TREATMENT: Tamoxifen   INTERVAL HISTORY: Robyn Hobbs returns today for follow-up of her estrogen receptor positive breast cancer.   She continues on tamoxifen, with good tolerance.  Currently neither hot flashes nor vaginal wetness or issues of concern  Since her last visit, she underwent bone density testing on 09/26/2018. This showed a T-score of -1.0, which is considered normal.  She also underwent bilateral diagnostic mammography with tomography at [facility] on 03/22/2019 showing: breast density category C; no evidence of malignancy in either breast.   REVIEW OF SYSTEMS: Robyn Hobbs continues to have GI problems and has been working with Dr. Carlean Purl regarding this.  He diagnosed to have bacterial overgrowth and tried some antibiotics which she says that were not very helpful.  She has proved to be negative for celiac disease although she did have slightly low IgA levels, which is is common and generally not important.  He is now trying other medications which she has had some difficulty obtaining because of cost.  Aside from these issues, which are longstanding and not different from prior, a detailed review of systems today was stable   BREAST CANCER HISTORY: From the original intake note:  [name] had screening mammography suggesting a possible mass in the right breast and [facility] right diagnostic mammography with tomography and right breast ultrasonography was performed 02/17/2016. The breast density was category C. There was set up mass in the posterior third of the upper central right breast which was not palpable by exam. Ultrasonography confirmed an irregular hypoechoic mass measuring approximately 0.9 cm. Survey of the right axilla was unremarkable.  Biopsy of the right breast mass in question 02/19/2016 showed ([id number]) an invasive ductal carcinoma, grade 1, estrogen receptor 100% positive, with strong staining intensity, progesterone receptor negative, with an MIB-1 of 2%, and no HER-2 amplification, the signals ratio being 1.00 and the number per cell 1.05.  Her subsequent history is as detailed below.   PAST MEDICAL HISTORY: Past Medical History:  Diagnosis Date  . Allergy   . Arthritis   . Breast cancer ([place])   . Breast cancer of upper-inner quadrant of right female breast ([place]) 02/25/2016  . Cataract   . History of radiation therapy 05/18/16- 06/08/16   Right Breast 42.56 Gy in 16 fractions.   . Increased liver enzymes   . Osteopenia   . Personal history of radiation therapy   . Small intestinal bacterial overgrowth 03/21/2019   44 ppm increase H2, 13 ppm methane 57 ppm total lactulose breath test     PAST SURGICAL HISTORY: Past Surgical History:  Procedure Laterality Date  . BLADDER REPAIR    . BREAST BIOPSY    . BREAST LUMPECTOMY    . BREAST LUMPECTOMY WITH RADIOACTIVE SEED AND SENTINEL LYMPH NODE BIOPSY Right 03/17/2016   Procedure: BREAST LUMPECTOMY WITH RADIOACTIVE SEED AND SENTINEL LYMPH NODE BIOPSY;  Surgeon: Autumn Messing III, MD;  Location: [facility];  Service:  General;  Laterality: Right;  BREAST LUMPECTOMY WITH RADIOACTIVE SEED AND SENTINEL LYMPH NODE BIOPSY  . COLONOSCOPY  multiple  . Excision of Melanoma  on back    . TONSILLECTOMY     and adenoidectomy  . VAGINAL HYSTERECTOMY  2001    FAMILY HISTORY  Family History  Problem Relation Age of Onset  . Kidney disease Father   . Colon cancer Maternal Grandmother   . Colon cancer Paternal Grandfather   . Colon cancer Paternal Grandmother   . Esophageal cancer Neg Hx   . Liver cancer Neg Hx   . Pancreatic cancer Neg Hx   . Rectal cancer Neg Hx   . Stomach cancer Neg Hx   . Breast cancer Neg Hx   The patient's father died at the age of 43 from kidney cancer. The patient's mother died at the age of 60. The patient had one brother, no sisters. On the mother's side both the grandmother and grandfather had colon cancer diagnosed in their 52s. The patient's brother had prostate cancer at age 22. The patient's paternal grandfather was diagnosed with lung cancer in his late 42s. There is no history of breast or ovarian cancer in the family   GYNECOLOGIC HISTORY:  No LMP recorded. Patient is postmenopausal. Menarche age 57, first live birth age 84, the patient is [place] P3. She underwent full abdominal hysterectomy with bilateral salpingo-oophorectomy June 2001. She took hormone replacement for less than a year, stopping in 2002. She used oral contraceptives for approximately 5 years remotely without complications.   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 Her husband Reynolds Bowl") used to work in [employer] but is now retired. Son [name] lives in [place] and is a Hydrologist for [employer], son [name] lives in [place] and runs [employer] that imports [employer] technologist, McLean Lives in [place] Where He Works in [employer]. The Patient Has 6 Grandchildren. She Attends a [employer].    ADVANCED DIRECTIVES: In place   HEALTH MAINTENANCE: Social History   Tobacco Use  . Smoking status: Never Smoker  . Smokeless tobacco: Never Used  Substance Use Topics  . Alcohol use: Yes    Alcohol/week: 3.0 standard drinks    Types: 3 Glasses of wine per week    Comment: she is not drinking at  this time.   . Drug use: No     Colonoscopy: 2014/ Dr. Carlean Purl  PAP:  Bone density: July 2017   Allergies  Allergen Reactions  . Morphine And Related Swelling    Current Outpatient Medications  Medication Sig Dispense Refill  . beta carotene w/minerals (OCUVITE) tablet Take 1 tablet by mouth daily.    . cholecalciferol (VITAMIN D) 1000 UNITS tablet Take 2,000 Units by mouth daily.    . Cranberry 1000 MG CAPS Take 1 capsule by mouth daily.    . rifaximin (XIFAXAN) 550 MG TABS tablet Take 1 tablet (550 mg total) by mouth 3 (three) times daily for 14 days. 42 tablet 0  . tamoxifen (NOLVADEX) 20 MG tablet TAKE 1 TABLET(20 MG) BY MOUTH DAILY 90 tablet 0   No current facility-administered medications for this visit.     OBJECTIVE: Middle-aged white woman who appears stated age  68:   05/17/19 1335  BP: 133/60  Pulse: 69  Resp: 18  Temp: 98.2 F (36.8 C)  SpO2: 100%     Body mass index is 20.83 kg/m.    ECOG FS:0 - Asymptomatic  Sclerae unicteric, EOMs intact Wearing a mask No cervical or supraclavicular adenopathy Lungs no rales or rhonchi Heart regular rate and rhythm Abd soft, nontender, positive bowel sounds MSK no focal spinal tenderness, no upper extremity lymphedema Neuro: nonfocal, well oriented, appropriate affect Breasts: The right breast has undergone lumpectomy followed by radiation.  There is no evidence of disease recurrence.  The left breast is benign.  Both axillae are benign.   LAB RESULTS:  CMP     Component Value Date/Time   NA 140 05/17/2019 1322   NA 141 05/02/2017 1500   K 4.4 05/17/2019 1322   K 4.1 05/02/2017 1500   CL 104 05/17/2019 1322   CO2 29 05/17/2019 1322   CO2 28 05/02/2017 1500   GLUCOSE 93 05/17/2019 1322   GLUCOSE 77 05/02/2017 1500   BUN 16 05/17/2019 1322   BUN 19.0 05/02/2017 1500   CREATININE 0.93 05/17/2019 1322   CREATININE 0.9 05/02/2017 1500   CALCIUM 9.1 05/17/2019 1322   CALCIUM 9.6 05/02/2017 1500   PROT  6.6 05/17/2019 1322   PROT 7.0 05/02/2017 1500   ALBUMIN 4.0 05/17/2019 1322   ALBUMIN 4.3 05/02/2017 1500   AST 19 05/17/2019 1322   AST 26 05/02/2017 1500   ALT 21 05/17/2019 1322   ALT 29 05/02/2017 1500   ALKPHOS 52 05/17/2019 1322   ALKPHOS 39 (L) 05/02/2017 1500   BILITOT 0.3 05/17/2019 1322   BILITOT 0.62 05/02/2017 1500   GFRNONAA >60 05/17/2019 1322   GFRAA >60 05/17/2019 1322    INo results found for: SPEP, UPEP  Lab Results  Component Value Date   WBC 6.4 05/17/2019   NEUTROABS 3.8 05/17/2019   HGB 13.2 05/17/2019   HCT 41.3 05/17/2019   MCV 89.0 05/17/2019   PLT 214 05/17/2019      Chemistry      Component Value Date/Time   NA 140 05/17/2019 1322   NA 141 05/02/2017 1500   K 4.4 05/17/2019 1322   K 4.1 05/02/2017 1500   CL 104 05/17/2019 1322   CO2 29 05/17/2019 1322   CO2 28 05/02/2017 1500   BUN 16 05/17/2019 1322   BUN 19.0 05/02/2017 1500   CREATININE 0.93 05/17/2019 1322   CREATININE 0.9 05/02/2017 1500      Component Value Date/Time   CALCIUM 9.1 05/17/2019 1322   CALCIUM 9.6 05/02/2017 1500   ALKPHOS 52 05/17/2019 1322   ALKPHOS 39 (L) 05/02/2017 1500   AST 19 05/17/2019 1322   AST 26 05/02/2017 1500   ALT 21 05/17/2019 1322   ALT 29 05/02/2017 1500   BILITOT 0.3 05/17/2019 1322   BILITOT 0.62 05/02/2017 1500       No results found for: LABCA2  No components found for: LABCA125  No results for input(s): INR in the last 168 hours.  Urinalysis No results found for: COLORURINE, APPEARANCEUR, LABSPEC, PHURINE, GLUCOSEU, HGBUR, BILIRUBINUR, KETONESUR, PROTEINUR, UROBILINOGEN, NITRITE, LEUKOCYTESUR   STUDIES: No results found.   ELIGIBLE FOR AVAILABLE RESEARCH PROTOCOL: no  ASSESSMENT: 68 y.o. Robyn Hobbs, Robyn Hobbs woman status post right breast upper inner quadrant biopsy 02/19/2016 for a clinical T1b N0, clinical stage IA invasive ductal carcinoma, grade 1, estrogen receptor positive, progesterone receptor and HER-2 negative, with an  MIB-1 of 2.   (1) status post right lumpectomy and sentinel lymph node sampling 05/17/2016 for a pT1c pN0, stage IA invasive ductal carcinoma, grade 1, with negative margins  (2) Oncotype DX score of 20 predicts a 10 year risk of recurrence  outside the breast of 13% if the patient's only systemic treatment is tamoxifen for 5 years  (a) given the marginal predicted benefit from chemotherapy, the patient opted against that  (3) adjuvant radiation 05/18/16 - 06/08/16:  Right breast: 42.56 Gy in 16 fractions.  (4) started tamoxifen 07/25/2016  PLAN: [place] is 3 years out from definitive surgery for her breast cancer with no evidence of disease recurrence.  This is very favorable.  She is tolerating tamoxifen well and the plan is to continue that an additional 2 years to make a total of 5.  I certainly do not have a solution for her GI issues but I did share a recipe with her that I find is helpful in terms of the got microbiome  She will return to see me in 1 year.  She knows to call for any other issue that may develop before then.   Quinne Pires, Virgie Dad, MD  05/17/19 2:05 PM Medical Oncology and Hematology [facility] [place] Robyn Hobbs, [place] [address] Tel. [phone]    Fax. [phone]   I, Wilburn Mylar, am acting as scribe for Dr. Virgie Dad. Makia Bossi.  I, Lurline Del MD, have reviewed the above documentation for accuracy and completeness, and I agree with the above.

## 2019-05-16 NOTE — Telephone Encounter (Signed)
Pt inquired about results of blood tests and plan of care.

## 2019-05-16 NOTE — Telephone Encounter (Signed)
Patient calling about the celiac labs and next steps.  Please review and advise

## 2019-05-17 ENCOUNTER — Inpatient Hospital Stay: Payer: Medicare HMO | Admitting: Oncology

## 2019-05-17 ENCOUNTER — Inpatient Hospital Stay: Payer: Medicare HMO | Attending: Oncology

## 2019-05-17 ENCOUNTER — Telehealth: Payer: Self-pay

## 2019-05-17 ENCOUNTER — Other Ambulatory Visit: Payer: Self-pay

## 2019-05-17 VITALS — BP 133/60 | HR 69 | Temp 98.2°F | Resp 18 | Ht 65.0 in | Wt 125.2 lb

## 2019-05-17 DIAGNOSIS — Z800 Family history of malignant neoplasm of digestive organs: Secondary | ICD-10-CM | POA: Diagnosis not present

## 2019-05-17 DIAGNOSIS — Z170 Estrogen receptor positive status [ER+]: Secondary | ICD-10-CM

## 2019-05-17 DIAGNOSIS — M8580 Other specified disorders of bone density and structure, unspecified site: Secondary | ICD-10-CM | POA: Insufficient documentation

## 2019-05-17 DIAGNOSIS — M1990 Unspecified osteoarthritis, unspecified site: Secondary | ICD-10-CM | POA: Diagnosis not present

## 2019-05-17 DIAGNOSIS — C50211 Malignant neoplasm of upper-inner quadrant of right female breast: Secondary | ICD-10-CM | POA: Diagnosis not present

## 2019-05-17 DIAGNOSIS — Z885 Allergy status to narcotic agent status: Secondary | ICD-10-CM | POA: Insufficient documentation

## 2019-05-17 DIAGNOSIS — Z923 Personal history of irradiation: Secondary | ICD-10-CM | POA: Diagnosis not present

## 2019-05-17 DIAGNOSIS — Z79810 Long term (current) use of selective estrogen receptor modulators (SERMs): Secondary | ICD-10-CM | POA: Insufficient documentation

## 2019-05-17 DIAGNOSIS — Z801 Family history of malignant neoplasm of trachea, bronchus and lung: Secondary | ICD-10-CM | POA: Insufficient documentation

## 2019-05-17 DIAGNOSIS — Z90722 Acquired absence of ovaries, bilateral: Secondary | ICD-10-CM | POA: Insufficient documentation

## 2019-05-17 DIAGNOSIS — D802 Selective deficiency of immunoglobulin A [IgA]: Secondary | ICD-10-CM

## 2019-05-17 DIAGNOSIS — Z79899 Other long term (current) drug therapy: Secondary | ICD-10-CM | POA: Insufficient documentation

## 2019-05-17 DIAGNOSIS — Z85820 Personal history of malignant melanoma of skin: Secondary | ICD-10-CM | POA: Insufficient documentation

## 2019-05-17 DIAGNOSIS — Z841 Family history of disorders of kidney and ureter: Secondary | ICD-10-CM | POA: Diagnosis not present

## 2019-05-17 HISTORY — DX: Selective deficiency of immunoglobulin a (iga): D80.2

## 2019-05-17 LAB — CBC WITH DIFFERENTIAL/PLATELET
Abs Immature Granulocytes: 0.01 10*3/uL (ref 0.00–0.07)
Basophils Absolute: 0.1 10*3/uL (ref 0.0–0.1)
Basophils Relative: 1 %
Eosinophils Absolute: 0.2 10*3/uL (ref 0.0–0.5)
Eosinophils Relative: 3 %
HCT: 41.3 % (ref 36.0–46.0)
Hemoglobin: 13.2 g/dL (ref 12.0–15.0)
Immature Granulocytes: 0 %
Lymphocytes Relative: 31 %
Lymphs Abs: 2 10*3/uL (ref 0.7–4.0)
MCH: 28.4 pg (ref 26.0–34.0)
MCHC: 32 g/dL (ref 30.0–36.0)
MCV: 89 fL (ref 80.0–100.0)
Monocytes Absolute: 0.4 10*3/uL (ref 0.1–1.0)
Monocytes Relative: 6 %
Neutro Abs: 3.8 10*3/uL (ref 1.7–7.7)
Neutrophils Relative %: 59 %
Platelets: 214 10*3/uL (ref 150–400)
RBC: 4.64 MIL/uL (ref 3.87–5.11)
RDW: 12.9 % (ref 11.5–15.5)
WBC: 6.4 10*3/uL (ref 4.0–10.5)
nRBC: 0 % (ref 0.0–0.2)

## 2019-05-17 LAB — COMPREHENSIVE METABOLIC PANEL
ALT: 21 U/L (ref 0–44)
AST: 19 U/L (ref 15–41)
Albumin: 4 g/dL (ref 3.5–5.0)
Alkaline Phosphatase: 52 U/L (ref 38–126)
Anion gap: 7 (ref 5–15)
BUN: 16 mg/dL (ref 8–23)
CO2: 29 mmol/L (ref 22–32)
Calcium: 9.1 mg/dL (ref 8.9–10.3)
Chloride: 104 mmol/L (ref 98–111)
Creatinine, Ser: 0.93 mg/dL (ref 0.44–1.00)
GFR calc Af Amer: >60 mL/min (ref >60)
GFR calc non Af Amer: >60 mL/min (ref >60)
Glucose, Bld: 93 mg/dL (ref 70–99)
Potassium: 4.4 mmol/L (ref 3.5–5.1)
Sodium: 140 mmol/L (ref 135–145)
Total Bilirubin: 0.3 mg/dL (ref 0.3–1.2)
Total Protein: 6.6 g/dL (ref 6.5–8.1)

## 2019-05-17 MED ORDER — RIFAXIMIN 550 MG PO TABS: 550.0000 mg | ORAL_TABLET | Freq: Three times a day (TID) | ORAL | 0 refills | Status: AC

## 2019-05-17 NOTE — Progress Notes (Signed)
Robyn Hobbs,  Sorry for the explanation delay.  You do not have celiac disease (gluten allergy). Good news!  I am prescribing Xifaxan as we discussed  If t is unaffordable do not take it and let  me know  See you in November, hopefully you will feel much better.  I appreciate the opportunity to care for you.   Gatha Mayer, MD, Robyn Hobbs

## 2019-05-17 NOTE — Telephone Encounter (Signed)
My chart message sent - neg celiac Xifaxan Rx

## 2019-05-17 NOTE — Telephone Encounter (Signed)
I called the pharmacy and they ran another [facility] # for patient's xifaxan and it came up as $0.00 for her. I called and told her and she was thrilled and will go pick it up.

## 2019-05-20 ENCOUNTER — Telehealth: Payer: Self-pay | Admitting: Oncology

## 2019-05-20 NOTE — Telephone Encounter (Signed)
I talk with patient regarding schedule  

## 2019-05-28 ENCOUNTER — Encounter: Payer: Self-pay | Admitting: Physical Therapy

## 2019-05-28 ENCOUNTER — Other Ambulatory Visit: Payer: Self-pay

## 2019-05-28 ENCOUNTER — Ambulatory Visit: Payer: Medicare HMO | Attending: Internal Medicine | Admitting: Physical Therapy

## 2019-05-28 DIAGNOSIS — M6281 Muscle weakness (generalized): Secondary | ICD-10-CM

## 2019-05-28 DIAGNOSIS — R278 Other lack of coordination: Secondary | ICD-10-CM | POA: Insufficient documentation

## 2019-05-28 DIAGNOSIS — K6289 Other specified diseases of anus and rectum: Secondary | ICD-10-CM | POA: Diagnosis not present

## 2019-05-28 NOTE — Therapy (Addendum)
[facility] [address], [place], [place], [address] Phone: [phone]   Fax:  [phone]  Physical Therapy Treatment  Patient Details  Name: Robyn Hobbs MRN: [id number] Date of Birth: 1951-06-27 Referring Provider (PT): Dr. Silvano Rusk   Encounter Date: 05/28/2019  PT End of Session - 05/28/19 1144    Visit Number  4    Date for PT Re-Evaluation  07/02/19    Authorization Type  aetna medicare    PT Start Time  1100    PT Stop Time  1140    PT Time Calculation (min)  40 min    Activity Tolerance  Patient tolerated treatment well    Behavior During Therapy  [facility] for tasks assessed/performed       Past Medical History:  Diagnosis Date  . Allergy   . Arthritis   . Breast cancer ([place])   . Breast cancer of upper-inner quadrant of right female breast ([place]) 02/25/2016  . Cataract   . History of radiation therapy 05/18/16- 06/08/16   Right Breast 42.56 Gy in 16 fractions.   . Increased liver enzymes   . Osteopenia   . Personal history of radiation therapy   . 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 BLADDER REPAIR    . BREAST BIOPSY    . BREAST LUMPECTOMY    . BREAST LUMPECTOMY WITH RADIOACTIVE SEED AND SENTINEL LYMPH NODE BIOPSY Right 03/17/2016   Procedure: BREAST LUMPECTOMY WITH RADIOACTIVE SEED AND SENTINEL LYMPH NODE BIOPSY;  Surgeon: Autumn Messing III, MD;  Location: [facility];  Service: General;  Laterality: Right;  BREAST LUMPECTOMY WITH RADIOACTIVE SEED AND SENTINEL LYMPH NODE BIOPSY  . COLONOSCOPY  multiple  . Excision of Melanoma  on back    . TONSILLECTOMY     and adenoidectomy  . VAGINAL HYSTERECTOMY  2001    There were no vitals filed for this visit.  Subjective Assessment - 05/28/19 1105    Subjective  I have been in the bathroom all day. I  had to go into the commode 4 times to have a bowel movement. I did not bring the food diary but everything is different. I had one night of fecal leakage. No fecal leakage today. Some days controlling gas is better but somedays not.    Patient Stated Goals  control the muscles to reduce the gas and get to the commode in time    Currently in Pain?  No/denies                    Pelvic Floor Special Questions - 05/28/19 0001    Biofeedback  sitting resting level is 1 uv, sitting holding at 50% above 8 uv, then holding for 100% for 5 seconds    Biofeedback sensor type  Surface   rectal        [facility] Adult PT Treatment/Exercise - 05/28/19 0001      Self-Care   Self-Care  Other Self-Care Comments    Other Self-Care Comments   discussed with patient on her diet, adding more fiber, what is the FODMAP diet, she can eat bannans without issues             PT Education - 05/28/19 1141    Education Details  full contraction 5 sec, 50% contraction in sitting  Person(s) Educated  Patient    Methods  Explanation;Demonstration;Verbal cues;Handout    Comprehension  Returned demonstration;Verbalized understanding       PT Short Term Goals - 04/04/19 0856      PT SHORT TERM GOAL #1   Title  independent with initial HEP    Time  4    Period  Weeks    Status  Achieved    Target Date  04/11/19      PT SHORT TERM GOAL #2   Title  able to control gas >/= 25% of the time due to improved pelvic floor strength    Time  4    Period  Weeks    Status  Achieved      PT SHORT TERM GOAL #3   Title  education on how to bulk up her stool to reduce watery stool    Time  4    Period  Weeks    Status  Achieved      PT SHORT TERM GOAL #5   Status  Achieved        PT Long Term Goals - 05/28/19 1108      PT LONG TERM GOAL #1   Title  independent with HEP and understand how to progress herself    Time  8    Period  Weeks    Status  On-going      PT LONG TERM GOAL #2   Title   stool more of Type 4 than than Type 5 or 6 due to increased bulking of stool    Time  8    Period  Weeks    Status  On-going      PT LONG TERM GOAL #3   Title  anal sphincter strength >/= 3/5 with circular contraction due to improve tissue mobility of the anterior sphincter    Time  8    Period  Weeks    Status  On-going      PT LONG TERM GOAL #4   Title  gas during the day improved >/= 75% due to improve sphincter control    Time  8    Period  Weeks    Status  On-going      PT LONG TERM GOAL #5   Title  when she has the urge to have a stool movement she is able to walk slowly to the commode without stool leakage due to improved sphincter control    Time  8    Period  Weeks    Status  Achieved            Plan - 05/28/19 1105    Clinical Impression Statement  Patient is still having days with diarrhea and some days with firm stool. Patient had to go to the bathroom 4 times this morning. Patient resting tone for the pelvic floor in sitting is 1 uv. Patient is able to contract 505 to 8 uv for 5-10 seconds. Patient is able to perform a quick contraction of the pelvic floor to 20uv. Patient is able to contract the pelvic floor 100% at 5 uv for 5 seconds. Patient and therapist discussed diet and gave her information on FODMAP diet. Patient will benefit from skilled therapy to progress her pelvic floor strength.    Personal Factors and Comorbidities  Age;Comorbidity 1;Comorbidity 2    Comorbidities  s/p breast cancer; bladder tack    Examination-Activity Limitations  Transfers    Examination-Participation Restrictions  Community Activity;Shop;Interpersonal Relationship  Stability/Clinical Decision Making  Evolving/Moderate complexity    Rehab Potential  Excellent    PT Frequency  1x / week    PT Duration  8 weeks    PT Treatment/Interventions  Biofeedback;Therapeutic activities;Therapeutic exercise;Patient/family education;Neuromuscular re-education;Manual techniques;Dry needling     PT Next Visit Plan  See what Dr. Carlean Purl says about her diet, work on pelvic floor EMG for sitting contracting for 15 sec and 20 seconds, standing 5 seconds and elevator 2 steps    Recommended Other Services  MD signed all notes    Consulted and Agree with Plan of Care  Patient       Patient will benefit from skilled therapeutic intervention in order to improve the following deficits and impairments:  Decreased coordination, Increased fascial restricitons, Decreased activity tolerance, Decreased strength  Visit Diagnosis: Muscle weakness (generalized)  Other lack of coordination  Anal sphincter incompetence     Problem List Patient Active Problem List   Diagnosis Date Noted  . 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 Flatulence 05/04/2019  . Loose stools 05/04/2019  . Small intestinal bacterial overgrowth 03/21/2019  . Hx of adenomatous polyp of colon 09/27/2017  . Malignant neoplasm of upper-inner quadrant of right breast in female, estrogen receptor positive ([place]) 02/25/2016  . Irritable bowel syndrome with constipation 01/01/2016  . Osteopenia 12/31/2015    Earlie Counts, PT 05/28/19 11:49 AM   [facility] [address] [place], [place], [address] Phone: [phone]   Fax:  [phone]  Name: Robyn Hobbs MRN: [id number] Date of Birth: 1951/01/15  PHYSICAL THERAPY DISCHARGE SUMMARY  Visits from Start of Care: 4  Current functional level related to goals / functional outcomes: See above.    Remaining deficits: See above.    Education / Equipment: HEP Plan: Patient agrees to discharge.  Patient goals were not met. Patient is being discharged due to not returning since the last visit. Thank you for the referral. Earlie Counts, PT 07/02/19 12:07 PM   ?????

## 2019-05-28 NOTE — Patient Instructions (Addendum)
Slow Contraction: Gravity Resisted (Sitting)    Sitting, slowly squeeze pelvic floor at 50 %  for _10__ seconds. Rest for __5_ seconds. Repeat 10___ times. Do __2_ times a day.  Copyright  VHI. All rights reserved.  Slow Contraction: Gravity Resisted (Sitting)    Sitting, fully  squeeze pelvic floor 100% for _5__ seconds. Rest for _5__ seconds. Repeat _5__ times. Do _2__ times a day.  Copyright  VHI. All rights reserved.  [facility] [address] [place], [place] [address] Phone # [phone] Fax [phone]

## 2019-06-04 ENCOUNTER — Encounter: Payer: Medicare HMO | Admitting: Physical Therapy

## 2019-06-11 ENCOUNTER — Ambulatory Visit: Payer: Medicare HMO | Admitting: Internal Medicine

## 2019-06-11 ENCOUNTER — Encounter: Payer: Self-pay | Admitting: Physical Therapy

## 2019-06-11 ENCOUNTER — Encounter: Payer: Self-pay | Admitting: Internal Medicine

## 2019-06-11 DIAGNOSIS — K6289 Other specified diseases of anus and rectum: Secondary | ICD-10-CM | POA: Diagnosis not present

## 2019-06-11 DIAGNOSIS — R195 Other fecal abnormalities: Secondary | ICD-10-CM | POA: Diagnosis not present

## 2019-06-11 DIAGNOSIS — K589 Irritable bowel syndrome without diarrhea: Secondary | ICD-10-CM | POA: Insufficient documentation

## 2019-06-11 DIAGNOSIS — K580 Irritable bowel syndrome with diarrhea: Secondary | ICD-10-CM

## 2019-06-11 DIAGNOSIS — R143 Flatulence: Secondary | ICD-10-CM

## 2019-06-11 DIAGNOSIS — K6389 Other specified diseases of intestine: Secondary | ICD-10-CM

## 2019-06-11 DIAGNOSIS — K638219 Small intestinal bacterial overgrowth, unspecified: Secondary | ICD-10-CM

## 2019-06-11 HISTORY — DX: Irritable bowel syndrome, unspecified: K58.9

## 2019-06-11 NOTE — Assessment & Plan Note (Addendum)
retest to determine if this has been eradicated.  If not consider different antibiotics.  If this has resolved that will tell us that her problems were not related to small intestinal bacterial overgrowth

## 2019-06-11 NOTE — Patient Instructions (Signed)
You have been given a testing kit to check for small intestine bacterial overgrowth (SIBO) which is completed by a company named [employer]. Make sure to return your test in the mail using the return mailing label given you along with the kit. Your demographic and insurance information have already been sent to the company and they should be in contact with you over the next week regarding this test. Please keep in mind that you will be getting a call from phone number [phone] or a similar number. If you do not hear from them within this time frame, please call our office at [phone].    Please call in early December for a January follow up appointment.   I appreciate the opportunity to care for you. Silvano Rusk, MD,  [facility]

## 2019-06-11 NOTE — Assessment & Plan Note (Signed)
Improved.  Question partial response to SIBO treatment.  Question diet.  FODMAPs restriction certainly may be helping.

## 2019-06-11 NOTE — Assessment & Plan Note (Addendum)
Recheck SIBO hydrogen breath test ? Colon/flex to look for possible microscopic colitis though would expect more watery diarrhea ?  Empiric pancreatic enzyme supplements ? Modify Health referral to work on the FODMAPs diet She is negative for celiac testing I believe she has a slightly low IgA level so functionally I do not think she has IgA deficiency but could consider checking other immunoglobulin levels though she does not fit the profile for somebody with common variable immune deficiency that can lead to symptoms and signs like this.  Once the lactulose hydrogen breath test is back we will determine the next steps.  I have instructed her to call to get a January follow-up as we think we need intermittent in person visits though I can coordinate care in between through my chart or the phone

## 2019-06-11 NOTE — Progress Notes (Signed)
Robyn Hobbs 68 y.o. 1951-05-07 [id number]  Assessment & Plan:  Small intestinal bacterial overgrowth retest to determine if this has been eradicated.  If not consider different antibiotics.  If this has resolved that will tell us that her problems were not related to small intestinal bacterial overgrowth  Loose stools Recheck SIBO hydrogen breath test ? Colon/flex to look for possible microscopic colitis though would expect more watery diarrhea ?  Empiric pancreatic enzyme supplements ? Modify Health referral to work on the FODMAPs diet She is negative for celiac testing I believe she has a slightly low IgA level so functionally I do not think she has IgA deficiency but could consider checking other immunoglobulin levels though she does not fit the profile for somebody with common variable immune deficiency that can lead to symptoms and signs like this.  IBS (irritable bowel syndrome) See otherAssessment and plans for ideas and thoughts Note that she repeats that she does not like or want to take a daily medication  Flatulence Improved.  Question partial response to SIBO treatment.  Question diet.  FODMAPs restriction certainly may be helping.  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  She has been to pelvic floor physical therapy with Earlie Counts and had made significant strides.  Malachy Mood has appropriately advised her to try to avoid certain foods and gave her a handout about the FODMAPs diet.  She has found that avoiding salads and raw vegetables helps, she can eat bread bananas cooked vegetables and meats.  Her quality of life is better but she would still like to have an improved quality of life and worry less about what she eats causing  problems tickly in social situations.  She thinks she may be milk intolerance she knows she can tolerate cheese she has purchased almond milk but has not yet tried it.  She does think when she eats a bowl of cereal with milk that she may have increased gas.  She does not have watery stools.  I had retreated her with Xifaxan after the last visit (first ruled out celiac disease she had a mildly low IgA but really adequate amounts of that and a negative TTG IgA antibody).  The couple of days into the Xifaxan treatment she felt like her stools really formed up but that did not last.  It did not even last during the treatment.  She notes that the last colonoscopy prep she had really wiped her out and when she is due for another colonoscopy should she need one she does not want to use the Dulcolax and MiraLAX.  She does have defecation at night at times.  This disturbs her sleep and has made her more fatigued she thinks.  She does not like taking medications and would prefer not to take something every day. Allergies  Allergen Reactions  . Morphine And Related Swelling   Current Meds  Medication Sig  . beta carotene w/minerals (OCUVITE) tablet Take 1 tablet by mouth daily.  . cholecalciferol (VITAMIN D) 1000 UNITS tablet Take 2,000 Units by mouth daily.  . Cranberry 1000 MG CAPS Take 1 capsule by mouth daily.  . tamoxifen (NOLVADEX) 20 MG tablet TAKE 1 TABLET(20 MG) BY MOUTH DAILY   Past Medical History:  Diagnosis Date  .  Allergy   . Arthritis   . Breast cancer ([place])   . Breast cancer of upper-inner quadrant of right female breast ([facility]) 02/25/2016  . Cataract   . History of radiation therapy 05/18/16- 06/08/16   Right Breast 42.56 Gy in 16 fractions.   . Increased liver enzymes   . Osteopenia   . Personal history of radiation therapy   . Small intestinal bacterial overgrowth 03/21/2019   44 ppm increase H2, 13 ppm methane 57 ppm total lactulose breath test    Past Surgical History:   Procedure Laterality Date  . BLADDER REPAIR    . BREAST BIOPSY    . BREAST LUMPECTOMY    . BREAST LUMPECTOMY WITH RADIOACTIVE SEED AND SENTINEL LYMPH NODE BIOPSY Right 03/17/2016   Procedure: BREAST LUMPECTOMY WITH RADIOACTIVE SEED AND SENTINEL LYMPH NODE BIOPSY;  Surgeon: Autumn Messing III, MD;  Location: [facility];  Service: General;  Laterality: Right;  BREAST LUMPECTOMY WITH RADIOACTIVE SEED AND SENTINEL LYMPH NODE BIOPSY  . COLONOSCOPY  multiple  . Excision of Melanoma  on back    . TONSILLECTOMY     and adenoidectomy  . VAGINAL HYSTERECTOMY  2001   Social History   Social History Narrative   Married to [place]   never smoker, rare alcohol no drug use   family history includes Colon cancer in her maternal grandmother, paternal grandfather, and paternal grandmother; Kidney disease in her father.   Review of Systems As per HPI  Objective:   Physical Exam BP (!) 112/58   Pulse 63   Temp 97.6 F (36.4 C) (Oral)   Ht 5\' 5"  (1.651 m)   Wt 128 lb (58.1 kg)   BMI 21.30 kg/m   No acute distress

## 2019-06-11 NOTE — Assessment & Plan Note (Addendum)
See otherAssessment and plans for ideas and thoughts Note that she repeats that she does not like or want to take a daily medication

## 2019-06-11 NOTE — Assessment & Plan Note (Signed)
Seems like physical therapy has helped

## 2019-06-12 ENCOUNTER — Ambulatory Visit: Payer: Medicare HMO | Admitting: Physical Therapy

## 2019-06-16 DIAGNOSIS — K6389 Other specified diseases of intestine: Secondary | ICD-10-CM | POA: Diagnosis not present

## 2019-06-16 DIAGNOSIS — R195 Other fecal abnormalities: Secondary | ICD-10-CM | POA: Diagnosis not present

## 2019-06-16 DIAGNOSIS — K580 Irritable bowel syndrome with diarrhea: Secondary | ICD-10-CM | POA: Diagnosis not present

## 2019-06-17 DIAGNOSIS — Z20828 Contact with and (suspected) exposure to other viral communicable diseases: Secondary | ICD-10-CM | POA: Diagnosis not present

## 2019-06-19 ENCOUNTER — Telehealth: Payer: Self-pay | Admitting: Internal Medicine

## 2019-06-19 NOTE — Telephone Encounter (Signed)
Spoke with Robyn Hobbs and she wants to be seen sooner than January. She mailed off her SIBO kit Monday. We have set her up to be seen 06/28/2019 at 3:50pm.

## 2019-06-25 ENCOUNTER — Encounter: Payer: Medicare HMO | Admitting: Physical Therapy

## 2019-06-26 ENCOUNTER — Other Ambulatory Visit: Payer: Self-pay | Admitting: Oncology

## 2019-06-28 ENCOUNTER — Encounter: Payer: Self-pay | Admitting: Internal Medicine

## 2019-06-28 ENCOUNTER — Other Ambulatory Visit: Payer: Self-pay

## 2019-06-28 ENCOUNTER — Telehealth: Payer: Self-pay | Admitting: Internal Medicine

## 2019-06-28 ENCOUNTER — Ambulatory Visit: Payer: Medicare HMO | Admitting: Internal Medicine

## 2019-06-28 VITALS — BP 118/80 | HR 70 | Temp 98.2°F | Ht 65.0 in | Wt 123.8 lb

## 2019-06-28 DIAGNOSIS — K6389 Other specified diseases of intestine: Secondary | ICD-10-CM

## 2019-06-28 NOTE — Telephone Encounter (Signed)
I have the results  Test is +  Will discuss at visit

## 2019-06-28 NOTE — Telephone Encounter (Signed)
Pt stated that she spoke to lab--results will be sent this morning.  Pt does not need a call back unless results are not received.

## 2019-06-28 NOTE — Telephone Encounter (Signed)
Dr Carlean Purl did you receive her breath test results, I believe they email this to you.

## 2019-06-28 NOTE — Patient Instructions (Signed)
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   I will investigate this and the link to SIBO more.   Keep working on the [facility].  Will communicate by week of 12/14.  See you in January.  I appreciate the opportunity to care for you. Gatha Mayer, MD, [facility]

## 2019-06-28 NOTE — Assessment & Plan Note (Signed)
So it seems clear that she has this and it could be driving her symptoms.  The incompetent ileocecal valve Robyn Hobbs is a good when I think.  That may explain why she quickly recurs after antibiotics.  I will look into possible treatments for this would include massage.  Hold off on antibiotics right now as she has not had a durable response.  Consider using metronidazole to go after methane producing organisms.  She will continue her FODMAPs experiment.  I told her that 1 day of diarrhea out of 2 weeks does not mean that the FODMAPs is failed.  She has an appointment on the books for January and will follow up then but I will be in touch with her before then as well.

## 2019-06-28 NOTE — Progress Notes (Signed)
Robyn Hobbs 68 y.o. 1951/05/15 [id number]  Assessment & Plan:    Small intestinal bacterial overgrowth So it seems clear that she has this and it could be driving her symptoms.  The incompetent ileocecal valve [name] is a good when I think.  That may explain why she quickly recurs after antibiotics.  I will look into possible treatments for this would include massage.  Hold off on antibiotics right now as she has not had a durable response.  Consider using metronidazole to go after methane producing organisms.  She will continue her FODMAPs experiment.  I told her that 1 day of diarrhea out of 2 weeks does not mean that the FODMAPs is failed.  She has an appointment on the books for January and will follow up then but I will be in touch with her before then as well.    Subjective:   Chief Complaint: Small intestinal bacterial overgrowth  HPI Robyn Hobbs reports that she has been following a FODMAPs diet in the did well for several days to a couple of weeks but yesterday had a terrible day with diarrhea.  She is found she can eat bananas and some other foods.  She had a repeat lactulose hydrogen breath test.  It showed increase in hydrogen at 30 ppm and 10 bpm of methane which was normal but combined 40 ppm which is high.  Previously it was 52 and 12 so she has after treatment with Xifaxan as well as Augmentin in the past.  I had wanted to treat her with neomycin previously because of the elevated methane but she has some history of hearing loss and was concerned about that potential toxicity so did not.  Conversation with the aero diagnostics team showing that she has a late rise in hydrogen and methane, and that raises the question of an incompetent ileocecal valve.  No history of trauma or any damage and no surgery for that. Allergies  Allergen Reactions   Morphine And Related Swelling   Current Meds  Medication Sig   beta carotene w/minerals (OCUVITE) tablet Take 1 tablet by mouth  daily.   cholecalciferol (VITAMIN D) 1000 UNITS tablet Take 2,000 Units by mouth daily.   Cranberry 1000 MG CAPS Take 1 capsule by mouth daily.   tamoxifen (NOLVADEX) 20 MG tablet TAKE 1 TABLET(20 MG) BY MOUTH DAILY   Past Medical History:  Diagnosis Date   Allergy    Arthritis    Breast cancer ([facility])    Breast cancer of upper-inner quadrant of right female breast ([facility]) 02/25/2016   Cataract    History of radiation therapy 05/18/16- 06/08/16   Right Breast 42.56 Gy in 16 fractions.    Increased liver enzymes    Osteopenia    Personal history of radiation therapy    Small intestinal bacterial overgrowth 03/21/2019   44 ppm increase H2, 13 ppm methane 57 ppm total lactulose breath test    Past Surgical History:  Procedure Laterality Date   BLADDER REPAIR     BREAST BIOPSY     BREAST LUMPECTOMY     BREAST LUMPECTOMY WITH RADIOACTIVE SEED AND SENTINEL LYMPH NODE BIOPSY Right 03/17/2016   Procedure: BREAST LUMPECTOMY WITH RADIOACTIVE SEED AND SENTINEL LYMPH NODE BIOPSY;  Surgeon: Autumn Messing III, MD;  Location: [facility];  Service: General;  Laterality: Right;  BREAST LUMPECTOMY WITH RADIOACTIVE SEED AND SENTINEL LYMPH NODE BIOPSY   COLONOSCOPY  multiple   Excision of Melanoma  on back  TONSILLECTOMY     and adenoidectomy   VAGINAL HYSTERECTOMY  2001   Social History   Social History Narrative   Married to [place]   never smoker, rare alcohol no drug use   family history includes Colon cancer in her maternal grandmother, paternal grandfather, and paternal grandmother; Kidney disease in her father.   Review of Systems   Objective:   Physical Exam BP 118/80 (BP Location: Left Arm, Patient Position: Sitting)    Pulse 70    Temp 98.2 F (36.8 C)    Ht 5\' 5"  (1.651 m)    Wt 123 lb 12.8 oz (56.2 kg)    SpO2 98%    BMI 20.60 kg/m  No acute distress  15 minutes time spent with patient > half in counseling coordination of care

## 2019-07-08 DIAGNOSIS — H353132 Nonexudative age-related macular degeneration, bilateral, intermediate dry stage: Secondary | ICD-10-CM | POA: Diagnosis not present

## 2019-07-08 DIAGNOSIS — H52203 Unspecified astigmatism, bilateral: Secondary | ICD-10-CM | POA: Diagnosis not present

## 2019-07-08 DIAGNOSIS — H2513 Age-related nuclear cataract, bilateral: Secondary | ICD-10-CM | POA: Diagnosis not present

## 2019-07-08 DIAGNOSIS — H25013 Cortical age-related cataract, bilateral: Secondary | ICD-10-CM | POA: Diagnosis not present

## 2019-07-10 DIAGNOSIS — R634 Abnormal weight loss: Secondary | ICD-10-CM

## 2019-07-10 DIAGNOSIS — D802 Selective deficiency of immunoglobulin A [IgA]: Secondary | ICD-10-CM

## 2019-07-10 DIAGNOSIS — R197 Diarrhea, unspecified: Secondary | ICD-10-CM

## 2019-07-10 NOTE — Telephone Encounter (Signed)
Called her   Not eating due to diarrhea and losing weight   Plan:  Orders Placed This Encounter  Procedures  . Stool Giardia/Cryptosporidium  . CBC w/Diff  . Comp Met (CMET)  . Sed Rate (ESR)  . C-reactive protein  . Stool, WBC/Lactoferrin  . IgG, IgA, IgM  . tTG/DGP Screen    She will use loperamide prn  If we do not get an answer this way will consider CT abd/pelvis

## 2019-07-11 ENCOUNTER — Other Ambulatory Visit (INDEPENDENT_AMBULATORY_CARE_PROVIDER_SITE_OTHER): Payer: Medicare HMO

## 2019-07-11 DIAGNOSIS — D802 Selective deficiency of immunoglobulin A [IgA]: Secondary | ICD-10-CM

## 2019-07-11 DIAGNOSIS — R197 Diarrhea, unspecified: Secondary | ICD-10-CM | POA: Diagnosis not present

## 2019-07-11 DIAGNOSIS — R634 Abnormal weight loss: Secondary | ICD-10-CM

## 2019-07-11 LAB — COMPREHENSIVE METABOLIC PANEL
ALT: 23 U/L (ref 0–35)
AST: 23 U/L (ref 0–37)
Albumin: 4.5 g/dL (ref 3.5–5.2)
Alkaline Phosphatase: 40 U/L (ref 39–117)
BUN: 17 mg/dL (ref 6–23)
CO2: 29 mEq/L (ref 19–32)
Calcium: 9.6 mg/dL (ref 8.4–10.5)
Chloride: 100 mEq/L (ref 96–112)
Creatinine, Ser: 0.94 mg/dL (ref 0.40–1.20)
GFR: 58.73 mL/min — ABNORMAL LOW (ref >60.00)
Glucose, Bld: 100 mg/dL — ABNORMAL HIGH (ref 70–99)
Potassium: 4.3 mEq/L (ref 3.5–5.1)
Sodium: 138 mEq/L (ref 135–145)
Total Bilirubin: 0.4 mg/dL (ref 0.2–1.2)
Total Protein: 6.9 g/dL (ref 6.0–8.3)

## 2019-07-11 LAB — CBC WITH DIFFERENTIAL/PLATELET
Basophils Absolute: 0 10*3/uL (ref 0.0–0.1)
Basophils Relative: 0.7 % (ref 0.0–3.0)
Eosinophils Absolute: 0.1 10*3/uL (ref 0.0–0.7)
Eosinophils Relative: 1.2 % (ref 0.0–5.0)
HCT: 41.8 % (ref 36.0–46.0)
Hemoglobin: 14 g/dL (ref 12.0–15.0)
Lymphocytes Relative: 21.6 % (ref 12.0–46.0)
Lymphs Abs: 1.5 10*3/uL (ref 0.7–4.0)
MCHC: 33.4 g/dL (ref 30.0–36.0)
MCV: 85.2 fl (ref 78.0–100.0)
Monocytes Absolute: 0.5 10*3/uL (ref 0.1–1.0)
Monocytes Relative: 8 % (ref 3.0–12.0)
Neutro Abs: 4.7 10*3/uL (ref 1.4–7.7)
Neutrophils Relative %: 68.5 % (ref 43.0–77.0)
Platelets: 217 10*3/uL (ref 150.0–400.0)
RBC: 4.91 Mil/uL (ref 3.87–5.11)
RDW: 13.4 % (ref 11.5–15.5)
WBC: 6.9 10*3/uL (ref 4.0–10.5)

## 2019-07-11 LAB — SEDIMENTATION RATE: Sed Rate: 4 mm/hr (ref 0–30)

## 2019-07-11 LAB — C-REACTIVE PROTEIN: CRP: <1 mg/dL (ref 0.5–20.0)

## 2019-07-12 ENCOUNTER — Other Ambulatory Visit: Payer: Medicare HMO

## 2019-07-12 DIAGNOSIS — R197 Diarrhea, unspecified: Secondary | ICD-10-CM | POA: Diagnosis not present

## 2019-07-12 DIAGNOSIS — R634 Abnormal weight loss: Secondary | ICD-10-CM

## 2019-07-12 LAB — IGG, IGA, IGM
IgG (Immunoglobin G), Serum: 903 mg/dL (ref 600–1540)
IgM, Serum: 101 mg/dL (ref 50–300)
Immunoglobulin A: 64 mg/dL — ABNORMAL LOW (ref 70–320)

## 2019-07-12 LAB — TTG/DGP SCREEN: tTG/DGP Screen: NEGATIVE

## 2019-07-18 LAB — GIARDIA/CRYPTOSPORIDIUM (EIA)
MICRO NUMBER:: 1212769
MICRO NUMBER:: 1212770
RESULT:: NOT DETECTED
RESULT:: NOT DETECTED
SPECIMEN QUALITY:: ADEQUATE
SPECIMEN QUALITY:: ADEQUATE

## 2019-07-18 LAB — FECAL LACTOFERRIN, QUANT
Fecal Lactoferrin: NEGATIVE
MICRO NUMBER:: 1212946
SPECIMEN QUALITY:: ADEQUATE

## 2019-07-22 ENCOUNTER — Telehealth: Payer: Self-pay | Admitting: Internal Medicine

## 2019-07-22 DIAGNOSIS — R634 Abnormal weight loss: Secondary | ICD-10-CM

## 2019-07-22 NOTE — Telephone Encounter (Signed)
Patient notified of CT scheduled at [facility] on 07/25/19 3:45 arrival/.  She is reminded that she will need to bo pick up her instructions and contrast by 07/24/19

## 2019-07-22 NOTE — Telephone Encounter (Signed)
Pt wants to schedule CT scan asap. If possible for this week. She stated that her son is the COO of [facility] so he could help facilitate appt if needed.

## 2019-07-25 ENCOUNTER — Other Ambulatory Visit: Payer: Self-pay

## 2019-07-25 ENCOUNTER — Ambulatory Visit (HOSPITAL_COMMUNITY)
Admission: RE | Admit: 2019-07-25 | Discharge: 2019-07-25 | Disposition: A | Discharge status: Home / Self Care | Payer: Medicare HMO | Source: Ambulatory Visit | Attending: Internal Medicine | Admitting: Internal Medicine

## 2019-07-25 DIAGNOSIS — R634 Abnormal weight loss: Secondary | ICD-10-CM | POA: Diagnosis not present

## 2019-07-25 MED ORDER — IOHEXOL 300 MG/ML  SOLN
100.0000 mL | Freq: Once | INTRAMUSCULAR | Status: AC | PRN
  Administered 2019-07-25: 100 mL via INTRAVENOUS

## 2019-07-31 ENCOUNTER — Encounter: Payer: Self-pay | Admitting: Internal Medicine

## 2019-07-31 ENCOUNTER — Ambulatory Visit: Payer: Medicare HMO | Admitting: Internal Medicine

## 2019-07-31 VITALS — BP 116/60 | HR 72 | Temp 97.8°F | Ht 64.5 in | Wt 118.0 lb

## 2019-07-31 DIAGNOSIS — Z1159 Encounter for screening for other viral diseases: Secondary | ICD-10-CM

## 2019-07-31 DIAGNOSIS — R634 Abnormal weight loss: Secondary | ICD-10-CM | POA: Diagnosis not present

## 2019-07-31 DIAGNOSIS — K529 Noninfective gastroenteritis and colitis, unspecified: Secondary | ICD-10-CM

## 2019-07-31 MED ORDER — CLENPIQ 10-3.5-12 MG-GM -GM/160ML PO SOLN: 1.0000 | ORAL | 0 refills | Status: DC

## 2019-07-31 NOTE — Progress Notes (Signed)
   Robyn Hobbs 69 y.o. 1951/07/05 [id number]  Assessment & Plan:   Encounter Diagnoses  Name Primary?  . Chronic diarrhea Yes  . Loss of weight   . Special screening examination for viral disease     ? SIBO that is not treated Could have microscopic colitis/enteritis, other Restricted intake to avoid diarrhea is part of wgt loss   Plan EGD/colonoscopy  [id number], Youlanda Roys, MD   Subjective:   Chief Complaint: diarrhea, weight loss  HPI Still having diarrhea and weight loss. CT scan was ok   She cares for grandchildren and does not eat until she is safely at home to avoid urgent defecation. Stools watery.  Asking if she has been biopsied for microscopic colituis   Wt Readings from Last 3 Encounters:  07/31/19 118 lb (53.5 kg)  06/28/19 123 lb 12.8 oz (56.2 kg)  06/11/19 128 lb (58.1 kg)    Allergies  Allergen Reactions  . Morphine And Related Swelling   No outpatient medications have been marked as taking for the 07/31/19 encounter (Office Visit) with Gatha Mayer, MD.   Past Medical History:  Diagnosis Date  . Allergy   . Arthritis   . Breast cancer ([place] [facility])   . Breast cancer of upper-inner quadrant of right female breast ([place] [place]) 02/25/2016  . Cataract   . History of radiation therapy 05/18/16- 06/08/16   Right Breast 42.56 Gy in 16 fractions.   . Increased liver enzymes   . Osteopenia   . Personal history of radiation therapy   . Small intestinal bacterial overgrowth 03/21/2019   44 ppm increase H2, 13 ppm methane 57 ppm total lactulose breath test    Past Surgical History:  Procedure Laterality Date  . BLADDER REPAIR    . BREAST BIOPSY    . BREAST LUMPECTOMY    . BREAST LUMPECTOMY WITH RADIOACTIVE SEED AND SENTINEL LYMPH NODE BIOPSY Right 03/17/2016   Procedure: BREAST LUMPECTOMY WITH RADIOACTIVE SEED AND SENTINEL LYMPH NODE BIOPSY;  Surgeon: Autumn Messing III, MD;  Location: [facility];  Service: General;  Laterality: Right;  BREAST  LUMPECTOMY WITH RADIOACTIVE SEED AND SENTINEL LYMPH NODE BIOPSY  . COLONOSCOPY  multiple  . Excision of Melanoma  on back    . TONSILLECTOMY     and adenoidectomy  . VAGINAL HYSTERECTOMY  2001   Social History   Social History Narrative   Married to [place]   never smoker, rare alcohol no drug use   Originally from [place]   family history includes Colon cancer in her maternal grandmother, paternal grandfather, and paternal grandmother; Kidney disease in her father.   Review of Systems As abobe  Objective:   Physical Exam  BP 116/60 (BP Location: Left Arm, Patient Position: Sitting, Cuff Size: Normal)   Pulse 72   Temp 97.8 F (36.6 C)   Ht 5' 4.5" (1.638 m) Comment: height measured without shoes  Wt 118 lb (53.5 kg)   BMI 19.94 kg/m

## 2019-07-31 NOTE — Patient Instructions (Addendum)
You have been scheduled for an endoscopy and colonoscopy. Please follow the written instructions given to you at your visit today. Please use the sample you've been given today-Clenpiq If you use inhalers (even only as needed), please bring them with you on the day of your procedure.    I appreciate the opportunity to care for you. Silvano Rusk, MD, [facility]

## 2019-08-01 ENCOUNTER — Ambulatory Visit (INDEPENDENT_AMBULATORY_CARE_PROVIDER_SITE_OTHER): Payer: Medicare HMO

## 2019-08-01 DIAGNOSIS — Z1159 Encounter for screening for other viral diseases: Secondary | ICD-10-CM | POA: Diagnosis not present

## 2019-08-02 LAB — SARS CORONAVIRUS 2 (TAT 6-24 HRS): SARS Coronavirus 2: NEGATIVE

## 2019-08-05 ENCOUNTER — Ambulatory Visit (AMBULATORY_SURGERY_CENTER): Payer: Medicare HMO | Admitting: Internal Medicine

## 2019-08-05 ENCOUNTER — Encounter: Payer: Self-pay | Admitting: Internal Medicine

## 2019-08-05 ENCOUNTER — Other Ambulatory Visit: Payer: Self-pay

## 2019-08-05 VITALS — BP 147/71 | HR 59 | Temp 97.7°F | Resp 12 | Ht 64.0 in | Wt 118.0 lb

## 2019-08-05 DIAGNOSIS — R197 Diarrhea, unspecified: Secondary | ICD-10-CM | POA: Diagnosis not present

## 2019-08-05 DIAGNOSIS — K52831 Collagenous colitis: Secondary | ICD-10-CM

## 2019-08-05 DIAGNOSIS — R634 Abnormal weight loss: Secondary | ICD-10-CM | POA: Diagnosis not present

## 2019-08-05 DIAGNOSIS — K529 Noninfective gastroenteritis and colitis, unspecified: Secondary | ICD-10-CM

## 2019-08-05 MED ORDER — SODIUM CHLORIDE 0.9 % IV SOLN: 500.0000 mL | Freq: Once | INTRAVENOUS | Status: DC

## 2019-08-05 NOTE — Progress Notes (Signed)
Called to room to assist during endoscopic procedure.  Patient ID and intended procedure confirmed with present staff. Received instructions for my participation in the procedure from the performing physician.  

## 2019-08-05 NOTE — Progress Notes (Signed)
Pt's states no medical or surgical changes since previsit or office visit.  Temp JR VS DT

## 2019-08-05 NOTE — Op Note (Signed)
[facility] Patient Name: Robyn Hobbs Procedure Date: 08/05/2019 2:05 PM MRN: [id number] Endoscopist: Gatha Mayer , MD Age: 69 Referring MD:  Date of Birth: 01-25-1951 Gender: Female Account #: [id number] Procedure:                Colonoscopy Indications:              Chronic diarrhea, Clinically significant diarrhea                            of unexplained origin Medicines:                Propofol per Anesthesia, Monitored Anesthesia Care Procedure:                Pre-Anesthesia Assessment:                           - Prior to the procedure, a History and Physical                            was performed, and patient medications and                            allergies were reviewed. The patient's tolerance of                            previous anesthesia was also reviewed. The risks                            and benefits of the procedure and the sedation                            options and risks were discussed with the patient.                            All questions were answered, and informed consent                            was obtained. Prior Anticoagulants: The patient has                            taken no previous anticoagulant or antiplatelet                            agents. ASA Grade Assessment: II - A patient with                            mild systemic disease. After reviewing the risks                            and benefits, the patient was deemed in                            satisfactory condition to undergo the procedure.  After obtaining informed consent, the colonoscope                            was passed under direct vision. Throughout the                            procedure, the patient's blood pressure, pulse, and                            oxygen saturations were monitored continuously. The                            Colonoscope was introduced through the anus and                            advanced to the  the terminal ileum, with                            identification of the appendiceal orifice and IC                            valve. The colonoscopy was somewhat difficult due                            to restricted mobility of the colon. Successful                            completion of the procedure was aided by changing                            the patient's position. The patient tolerated the                            procedure well. The quality of the bowel                            preparation was excellent. The terminal ileum,                            ileocecal valve, appendiceal orifice, and rectum                            were photographed. The bowel preparation used was                            Clenpiq via split dose instruction. Scope In: 2:24:23 PM Scope Out: 2:35:49 PM Scope Withdrawal Time: 0 hours 7 minutes 31 seconds  Total Procedure Duration: 0 hours 11 minutes 26 seconds  Findings:                 The terminal ileum appeared normal.                           The entire examined colon appeared normal on direct  and retroflexion views.                           Biopsies for histology were taken with a cold                            forceps from the ascending colon, transverse colon,                            descending colon and sigmoid colon for evaluation                            of microscopic colitis. Complications:            No immediate complications. Estimated Blood Loss:     Estimated blood loss was minimal. Impression:               - The examined portion of the ileum was normal.                           - The entire examined colon is normal on direct and                            retroflexion views.                           - Biopsies were taken with a cold forceps from the                            ascending colon, transverse colon, descending colon                            and sigmoid colon for evaluation of  microscopic                            colitis. Recommendation:           - Patient has a contact number available for                            emergencies. The signs and symptoms of potential                            delayed complications were discussed with the                            patient. Return to normal activities tomorrow.                            Written discharge instructions were provided to the                            patient.                           - Resume previous diet.                           -  Continue present medications.                           - No recommendation at this time regarding repeat                            colonoscopy. Gatha Mayer, MD 08/05/2019 2:45:27 PM This report has been signed electronically.

## 2019-08-05 NOTE — Patient Instructions (Addendum)
I did not see any inflammation but took biopsies like we discussed.  I will call when I know results.  I appreciate the opportunity to care for you. Gatha Mayer, MD, FACG   YOU HAD AN ENDOSCOPIC PROCEDURE TODAY AT [facility]:   Refer to the procedure report that was given to you for any specific questions about what was found during the examination.  If the procedure report does not answer your questions, please call your gastroenterologist to clarify.  If you requested that your care partner not be given the details of your procedure findings, then the procedure report has been included in a sealed envelope for you to review at your convenience later.  YOU SHOULD EXPECT: Some feelings of bloating in the abdomen. Passage of more gas than usual.  Walking can help get rid of the air that was put into your GI tract during the procedure and reduce the bloating. If you had a lower endoscopy (such as a colonoscopy or flexible sigmoidoscopy) you may notice spotting of blood in your stool or on the toilet paper. If you underwent a bowel prep for your procedure, you may not have a normal bowel movement for a few days.  Please Note:  You might notice some irritation and congestion in your nose or some drainage.  This is from the oxygen used during your procedure.  There is no need for concern and it should clear up in a day or so.  SYMPTOMS TO REPORT IMMEDIATELY:   Following lower endoscopy (colonoscopy or flexible sigmoidoscopy):  Excessive amounts of blood in the stool  Significant tenderness or worsening of abdominal pains  Swelling of the abdomen that is new, acute  Fever of 100F or higher   Following upper endoscopy (EGD)  Vomiting of blood or coffee ground material  New chest pain or pain under the shoulder blades  Painful or persistently difficult swallowing  New shortness of breath  Fever of 100F or higher  Black, tarry-looking stools  For urgent or emergent  issues, a gastroenterologist can be reached at any hour by calling [phone].   DIET:  We do recommend a small meal at first, but then you may proceed to your regular diet.  Drink plenty of fluids but you should avoid alcoholic beverages for 24 hours.  ACTIVITY:  You should plan to take it easy for the rest of today and you should NOT DRIVE or use heavy machinery until tomorrow (because of the sedation medicines used during the test).    FOLLOW UP: Our staff will call the number listed on your records 48-72 hours following your procedure to check on you and address any questions or concerns that you may have regarding the information given to you following your procedure. If we do not reach you, we will leave a message.  We will attempt to reach you two times.  During this call, we will ask if you have developed any symptoms of COVID 19. If you develop any symptoms (ie: fever, flu-like symptoms, shortness of breath, cough etc.) before then, please call [phone].  If you test positive for Covid 19 in the 2 weeks post procedure, please call and report this information to [place].    If any biopsies were taken you will be contacted by phone or by letter within the next 1-3 weeks.  Please call us at [phone] if you have not heard about the biopsies in 3 weeks.    SIGNATURES/CONFIDENTIALITY: You and/or your  care partner have signed paperwork which will be entered into your electronic medical record.  These signatures attest to the fact that that the information above on your After Visit Summary has been reviewed and is understood.  Full responsibility of the confidentiality of this discharge information lies with you and/or your care-partner.

## 2019-08-05 NOTE — Progress Notes (Signed)
PT taken to PACU. Monitors in place. VSS. Report given to RN. 

## 2019-08-05 NOTE — Op Note (Signed)
[facility] Patient Name: Robyn Hobbs Procedure Date: 08/05/2019 2:05 PM MRN: [id number] Endoscopist: Gatha Mayer , MD Age: 69 Referring MD:  Date of Birth: 04-12-1951 Gender: Female Account #: [id number] Procedure:                Upper GI endoscopy Indications:              Diarrhea, Weight loss Medicines:                Propofol per Anesthesia, Monitored Anesthesia Care Procedure:                Pre-Anesthesia Assessment:                           - Prior to the procedure, a History and Physical                            was performed, and patient medications and                            allergies were reviewed. The patient's tolerance of                            previous anesthesia was also reviewed. The risks                            and benefits of the procedure and the sedation                            options and risks were discussed with the patient.                            All questions were answered, and informed consent                            was obtained. Prior Anticoagulants: The patient has                            taken no previous anticoagulant or antiplatelet                            agents. ASA Grade Assessment: II - A patient with                            mild systemic disease. After reviewing the risks                            and benefits, the patient was deemed in                            satisfactory condition to undergo the procedure.                           After obtaining informed consent, the endoscope was  passed under direct vision. Throughout the                            procedure, the patient's blood pressure, pulse, and                            oxygen saturations were monitored continuously. The                            Endoscope was introduced through the mouth, and                            advanced to the second part of duodenum. The upper                            GI  endoscopy was accomplished without difficulty.                            The patient tolerated the procedure well. Scope In: Scope Out: Findings:                 The esophagus was normal.                           The stomach was normal.                           The examined duodenum was normal.                           The cardia and gastric fundus were normal on                            retroflexion.                           Biopsies for histology were taken with a cold                            forceps in the entire duodenum for evaluation of                            celiac disease. Estimated blood loss was minimal. Complications:            No immediate complications. Estimated Blood Loss:     Estimated blood loss was minimal. Impression:               - Normal esophagus.                           - Normal stomach.                           - Normal examined duodenum.                           - Biopsies were taken with a cold forceps for  evaluation of celiac disease. Recommendation:           - Patient has a contact number available for                            emergencies. The signs and symptoms of potential                            delayed complications were discussed with the                            patient. Return to normal activities tomorrow.                            Written discharge instructions were provided to the                            patient.                           - Resume previous diet.                           - Continue present medications.                           - Await pathology results.                           - See the other procedure note for documentation of                            additional recommendations. Gatha Mayer, MD 08/05/2019 2:42:51 PM This report has been signed electronically.

## 2019-08-07 ENCOUNTER — Telehealth: Payer: Self-pay | Admitting: *Deleted

## 2019-08-07 NOTE — Telephone Encounter (Signed)
  Follow up Call-  Call back number 08/05/2019 09/21/2017  Post procedure Call Back phone  # [phone]  Permission to leave phone message Yes Yes  Some recent data might be hidden     Patient questions:  Do you have a fever, pain , or abdominal swelling? No. Pain Score  0 *  Have you tolerated food without any problems? Yes.    Have you been able to return to your normal activities? Yes.    Do you have any questions about your discharge instructions: Diet   No. Medications  No. Follow up visit  No.  Do you have questions or concerns about your Care? No.  Actions: * If pain score is 4 or above: 1. No action needed, pain <4.Have you developed a fever since your procedure? no  2.   Have you had an respiratory symptoms (SOB or cough) since your procedure? no  3.   Have you tested positive for COVID 19 since your procedure no  4.   Have you had any family members/close contacts diagnosed with the COVID 19 since your procedure?  no   If yes to any of these questions please route to Joylene John, RN and Alphonsa Gin, Therapist, sports.

## 2019-08-09 ENCOUNTER — Other Ambulatory Visit: Payer: Self-pay | Admitting: *Deleted

## 2019-08-09 ENCOUNTER — Telehealth: Payer: Self-pay | Admitting: Internal Medicine

## 2019-08-09 MED ORDER — BUDESONIDE 3 MG PO CPEP: 9.0000 mg | ORAL_CAPSULE | Freq: Every day | ORAL | 2 refills | Status: DC

## 2019-08-09 NOTE — Telephone Encounter (Signed)
Patient aware that the prescription has been sent to her pharmacy. She was grateful to know that Dr. Carlean Purl was going to call when he could because she had several questions concerning the medication (ie: is this permanent, what if it does not work, how long will it take to start working, diet, side effects/interactions, etc).

## 2019-08-09 NOTE — Telephone Encounter (Signed)
Pt would like to know her treatment plan. She stated that microscopic colitis was found in her last colon. Dr. Carlean Purl told her that he was going to prescribe a medication for that so pt would like to begin treatment right away.

## 2019-08-09 NOTE — Telephone Encounter (Signed)
Budesonide 3 mg  Take 3 (9mg ) daily  # 90 2 refills  I will call her also and review when I can

## 2019-08-09 NOTE — Telephone Encounter (Signed)
Please advise 

## 2019-08-13 NOTE — Telephone Encounter (Signed)
Reviewed results and plans with her yesterday  Stay on budesonide She is improving  Liberalize diet   She will call and make March appointment to see me and we will determine timing to stop budesonide

## 2019-08-18 ENCOUNTER — Other Ambulatory Visit: Payer: Self-pay

## 2019-08-18 ENCOUNTER — Ambulatory Visit: Payer: Medicare HMO | Attending: Internal Medicine

## 2019-08-18 ENCOUNTER — Ambulatory Visit: Payer: Medicare HMO

## 2019-08-18 DIAGNOSIS — Z23 Encounter for immunization: Secondary | ICD-10-CM | POA: Insufficient documentation

## 2019-08-18 NOTE — Progress Notes (Signed)
   Covid-19 Vaccination Clinic  Name:  Robyn Hobbs    MRN: [id number] DOB: 1951-05-07  08/18/2019  [name] was observed post Covid-19 immunization for 15 minutes without incidence. She was provided with Vaccine Information Sheet and instruction to access the V-Safe system.   [name] was instructed to call 911 with any severe reactions post vaccine: Marland Kitchen Difficulty breathing  . Swelling of your face and throat  . A fast heartbeat  . A bad rash all over your body  . Dizziness and weakness    Immunizations Administered    Name Date Dose VIS Date Route   Pfizer COVID-19 Vaccine 08/18/2019 10:48 AM 0.3 mL 07/05/2019 Intramuscular   Manufacturer: [place]   Lot: [id number]   [place]: [id number]

## 2019-08-19 ENCOUNTER — Encounter: Payer: Self-pay | Admitting: *Deleted

## 2019-08-19 ENCOUNTER — Encounter: Payer: Self-pay | Admitting: Oncology

## 2019-08-22 DIAGNOSIS — H25011 Cortical age-related cataract, right eye: Secondary | ICD-10-CM | POA: Diagnosis not present

## 2019-08-22 DIAGNOSIS — H2511 Age-related nuclear cataract, right eye: Secondary | ICD-10-CM | POA: Diagnosis not present

## 2019-08-22 DIAGNOSIS — H25811 Combined forms of age-related cataract, right eye: Secondary | ICD-10-CM | POA: Diagnosis not present

## 2019-08-22 DIAGNOSIS — H52221 Regular astigmatism, right eye: Secondary | ICD-10-CM | POA: Diagnosis not present

## 2019-08-26 DIAGNOSIS — R69 Illness, unspecified: Secondary | ICD-10-CM | POA: Diagnosis not present

## 2019-09-03 DIAGNOSIS — D225 Melanocytic nevi of trunk: Secondary | ICD-10-CM | POA: Diagnosis not present

## 2019-09-03 DIAGNOSIS — L853 Xerosis cutis: Secondary | ICD-10-CM | POA: Diagnosis not present

## 2019-09-03 DIAGNOSIS — L814 Other melanin hyperpigmentation: Secondary | ICD-10-CM | POA: Diagnosis not present

## 2019-09-03 DIAGNOSIS — Z85828 Personal history of other malignant neoplasm of skin: Secondary | ICD-10-CM | POA: Diagnosis not present

## 2019-09-03 DIAGNOSIS — L910 Hypertrophic scar: Secondary | ICD-10-CM | POA: Diagnosis not present

## 2019-09-03 DIAGNOSIS — L821 Other seborrheic keratosis: Secondary | ICD-10-CM | POA: Diagnosis not present

## 2019-09-04 ENCOUNTER — Ambulatory Visit: Payer: Medicare HMO

## 2019-09-09 ENCOUNTER — Ambulatory Visit: Payer: Medicare HMO | Attending: Internal Medicine

## 2019-09-09 DIAGNOSIS — Z23 Encounter for immunization: Secondary | ICD-10-CM | POA: Insufficient documentation

## 2019-09-09 NOTE — Progress Notes (Signed)
   Covid-19 Vaccination Clinic  Name:  Robyn Hobbs    MRN: [id number] DOB: 1950-09-29  09/09/2019  [name] was observed post Covid-19 immunization for 15 minutes without incidence. She was provided with Vaccine Information Sheet and instruction to access the V-Safe system.   [name] was instructed to call 911 with any severe reactions post vaccine: Marland Kitchen Difficulty breathing  . Swelling of your face and throat  . A fast heartbeat  . A bad rash all over your body  . Dizziness and weakness    Immunizations Administered    Name Date Dose VIS Date Route   Pfizer COVID-19 Vaccine 09/09/2019  1:01 PM 0.3 mL 07/05/2019 Intramuscular   Manufacturer: [place]   Lot: [id number]   [place]: [id number]

## 2019-09-20 ENCOUNTER — Encounter: Payer: Self-pay | Admitting: Internal Medicine

## 2019-09-20 ENCOUNTER — Ambulatory Visit: Payer: Medicare HMO | Admitting: Internal Medicine

## 2019-09-20 VITALS — BP 110/64 | HR 76 | Temp 98.0°F | Ht 64.5 in | Wt 121.5 lb

## 2019-09-20 DIAGNOSIS — K52831 Collagenous colitis: Secondary | ICD-10-CM

## 2019-09-20 DIAGNOSIS — R143 Flatulence: Secondary | ICD-10-CM

## 2019-09-20 DIAGNOSIS — K6289 Other specified diseases of anus and rectum: Secondary | ICD-10-CM

## 2019-09-20 DIAGNOSIS — K638219 Small intestinal bacterial overgrowth, unspecified: Secondary | ICD-10-CM

## 2019-09-20 DIAGNOSIS — K6389 Other specified diseases of intestine: Secondary | ICD-10-CM

## 2019-09-20 NOTE — Patient Instructions (Signed)
We are giving you a gas handout to read and follow.   Stop your budesonide per Dr Carlean Purl.   If you get more diarrhea call us.   Let us know if you have more problems or if you think you need to restart the budesonide.   I appreciate the opportunity to care for you. Silvano Rusk, MD, [facility]

## 2019-09-20 NOTE — Progress Notes (Signed)
Robyn Hobbs 69 y.o. 08/27/1950 [id number]  Assessment & Plan:   Encounter Diagnoses  Name Primary?  . Collagenous colitis Yes  . Flatulence   . Small intestinal bacterial overgrowth   . Anal sphincter incompetence      I have reviewed that we stop the budesonide and see whether she is someone who has 1 treatment course and no recurrence, episodic treatment or chronic treatment for collagenous colitis.  She is not on any medications typically associated with this and she does not appear to have celiac disease based upon duodenal biopsies.  The flatulence issue has been difficult and I think her anal sphincter incompetence and lack of ability to control the gas is a big problem for her and I am not sure how much can be done.  I have asked her to look at the FODMAPs diet again and also given her handout about gas and flatulence prevention.  She does use milk products but has not noticed that link.  She is willing to eliminate gluten but I do not know that that is contributing to the gaseousness.  Beano unlikely to help she does not really eat those types of beans.  1 consideration depending upon what happens with her diet changes she will try to make would be to consider a pancreatic enzyme supplement.  Given these issues with the potential ileocecal valve incompetence and the persistence of an abnormal SIBO test despite multiple rounds of antibiotics last year unlikely to treat again.   She plans to contact me in a few weeks with an update, sooner if needed.  CC: Haimes, Youlanda Roys, MD   Subjective:   Chief Complaint:  HPI Robyn Hobbs is here for follow-up in the setting of small intestinal bacterial overgrowth diagnosis, chronic diarrhea and recent diagnosis of collagenous colitis when colonoscopy was repeated.  She is much better after being on budesonide which started in mid January.  Rapid improvement stools are normal even "little hard balls now which I can live with".  Still  concerned because she has episodic uncontrollable flatulence.  She has a weakened anal sphincter and went through pelvic floor physical therapy with some benefit but still has this problem which is troubling.  She had a second SIBO test that raised the question of incompetence of the ileocecal valve.  It was at that point that we decided to repeat a colonoscopy, as we was coming to that decision, she has a daughter-in-law who is a dermatologist that also suggested that.  Duodenal biopsies did not show celiac disease.  She has not noticed particular food triggers for the gas.  However she was dealing with a bad diarrhea so it was confusing over the last couple of months.  Wt Readings from Last 3 Encounters:  09/20/19 121 lb 8 oz (55.1 kg)  08/05/19 118 lb (53.5 kg)  07/31/19 118 lb (53.5 kg)    Allergies  Allergen Reactions  . Morphine And Related Swelling   Current Meds  Medication Sig  . beta carotene w/minerals (OCUVITE) tablet Take 1 tablet by mouth daily.  . budesonide (ENTOCORT EC) 3 MG 24 hr capsule Take 3 capsules (9 mg total) by mouth daily.  . cholecalciferol (VITAMIN D) 1000 UNITS tablet Take 2,000 Units by mouth daily.  . Cranberry 1000 MG CAPS Take 1 capsule by mouth daily.  . tamoxifen (NOLVADEX) 20 MG tablet TAKE 1 TABLET(20 MG) BY MOUTH DAILY   Past Medical History:  Diagnosis Date  . Allergy   .  Arthritis   . Breast cancer ([place])   . Breast cancer of upper-inner quadrant of right female breast ([place]) 02/25/2016  . Cataract   . History of radiation therapy 05/18/16- 06/08/16   Right Breast 42.56 Gy in 16 fractions.   . Increased liver enzymes   . Osteopenia   . Personal history of radiation therapy   . Small intestinal bacterial overgrowth 03/21/2019   44 ppm increase H2, 13 ppm methane 57 ppm total lactulose breath test    Past Surgical History:  Procedure Laterality Date  . BLADDER REPAIR    . BREAST BIOPSY    . BREAST LUMPECTOMY    . BREAST LUMPECTOMY WITH  RADIOACTIVE SEED AND SENTINEL LYMPH NODE BIOPSY Right 03/17/2016   Procedure: BREAST LUMPECTOMY WITH RADIOACTIVE SEED AND SENTINEL LYMPH NODE BIOPSY;  Surgeon: Autumn Messing III, MD;  Location: [place];  Service: General;  Laterality: Right;  BREAST LUMPECTOMY WITH RADIOACTIVE SEED AND SENTINEL LYMPH NODE BIOPSY  . COLONOSCOPY  multiple  . Excision of Melanoma  on back    . TONSILLECTOMY     and adenoidectomy  . VAGINAL HYSTERECTOMY  2001   Social History   Social History Narrative   Married to [place]   never smoker, rare alcohol no drug use   Originally from [place]   family history includes Colon cancer in her maternal grandmother, paternal grandfather, and paternal grandmother; Kidney disease in her father.   Review of Systems cough which she relates to use of budesonide and says is in the list of side effects  Objective:   Physical Exam BP 110/64 (BP Location: Left Arm, Patient Position: Sitting, Cuff Size: Normal)   Pulse 76   Temp 98 F (36.7 C)   Ht 5' 4.5" (1.638 m)   Wt 121 lb 8 oz (55.1 kg)   BMI 20.53 kg/m

## 2019-09-21 ENCOUNTER — Other Ambulatory Visit: Payer: Self-pay | Admitting: Oncology

## 2019-10-03 DIAGNOSIS — H25812 Combined forms of age-related cataract, left eye: Secondary | ICD-10-CM | POA: Diagnosis not present

## 2019-10-03 DIAGNOSIS — H25012 Cortical age-related cataract, left eye: Secondary | ICD-10-CM | POA: Diagnosis not present

## 2019-10-03 DIAGNOSIS — H2512 Age-related nuclear cataract, left eye: Secondary | ICD-10-CM | POA: Diagnosis not present

## 2019-11-01 ENCOUNTER — Telehealth: Payer: Self-pay

## 2019-11-01 MED ORDER — PANCRELIPASE (LIP-PROT-AMYL) 36000-114000 UNITS PO CPEP: ORAL_CAPSULE | ORAL | 0 refills | Status: DC

## 2019-11-01 NOTE — Telephone Encounter (Signed)
Creon 36,000  samples put up front for pick up and note attached with sig and instructions to call us back if samples help.

## 2019-12-06 ENCOUNTER — Encounter: Payer: Self-pay | Admitting: Internal Medicine

## 2019-12-06 ENCOUNTER — Ambulatory Visit: Payer: Medicare HMO | Admitting: Internal Medicine

## 2019-12-06 VITALS — BP 102/60 | HR 59 | Temp 97.7°F | Ht 64.5 in | Wt 123.4 lb

## 2019-12-06 DIAGNOSIS — K52831 Collagenous colitis: Secondary | ICD-10-CM | POA: Diagnosis not present

## 2019-12-06 DIAGNOSIS — R143 Flatulence: Secondary | ICD-10-CM

## 2019-12-06 DIAGNOSIS — K582 Mixed irritable bowel syndrome: Secondary | ICD-10-CM | POA: Diagnosis not present

## 2019-12-06 NOTE — Patient Instructions (Signed)
Start with one caplet nightly of the [employer] (over the counter).  Let us know via [facility] how this works out for you.   Follow up with Dr Carlean Purl as needed.    I appreciate the opportunity to care for you. Silvano Rusk, MD, [facility]

## 2019-12-06 NOTE — Progress Notes (Signed)
Robyn Hobbs 69 y.o. 10/17/1950 [id number]  Assessment & Plan:   Encounter Diagnoses  Name Primary?  . Irritable bowel syndrome with both constipation and diarrhea Yes  . Flatulence   . Collagenous colitis     Her collagenous colitis is not active this seems like an IBS phenomena now. She is going to try small doses of milk of magnesia nightly to see if that provides more effective defecation and improves her quality of life. She will update me by my chart or scheduling a follow-up visit. I have told her regarding her gas and leakage of gas I am not sure what else we might do given what she has been through. Activated charcoal is a potential option and she will consider trying that pending response or lack thereof to milk of magnesia. CC: Robyn Hobbs, Robyn Roys, MD    Subjective:   Chief Complaint: Constipation  HPI Robyn Hobbs is a 69 year old white woman with a history of collagenous colitis, small intestinal bacterial overgrowth and anal sphincter incompetence who is now having problems with constipation when previous problems were loose stools diarrhea and gas. She still has leakage of gas at times if not particularly malodorous. Since putting her collagenous colitis into remission with budesonide she struggled with constipation. She is up to 2 teaspoons of Benefiber daily but still often has just small ball-like bowel movements. Previous trial of Creon for her gas was unhelpful. She has been treated with Augmentin and Xifaxan for SIBO. Those did not help her diarrhea and then we subsequently repeated a colonoscopy and did random biopsies that demonstrated the collagenous colitis. Allergies  Allergen Reactions  . Morphine And Related Swelling  . Sulfamethoxazole-Trimethoprim Other (See Comments)    Pt was prescribed 5 days of Bactrim DS , only able to tolerate 2 days of meds due to nausea and general malaise    Current Meds  Medication Sig  . beta carotene w/minerals (OCUVITE) tablet  Take 1 tablet by mouth daily.  . cholecalciferol (VITAMIN D) 1000 UNITS tablet Take 2,000 Units by mouth daily.  . Cranberry 1000 MG CAPS Take 1 capsule by mouth daily.  . tamoxifen (NOLVADEX) 20 MG tablet TAKE 1 TABLET(20 MG) BY MOUTH DAILY   Past Medical History:  Diagnosis Date  . Allergy   . Arthritis   . Breast cancer ([place])   . Breast cancer of upper-inner quadrant of right female breast ([place]) 02/25/2016  . Cataract   . Collagenous colitis   . History of radiation therapy 05/18/16- 06/08/16   Right Breast 42.56 Gy in 16 fractions.   . Increased liver enzymes   . Osteopenia   . Personal history of radiation therapy   . Small intestinal bacterial overgrowth 03/21/2019   44 ppm increase H2, 13 ppm methane 57 ppm total lactulose breath test   . Small intestinal bacterial overgrowth    Past Surgical History:  Procedure Laterality Date  . BLADDER REPAIR    . BREAST BIOPSY    . BREAST LUMPECTOMY    . BREAST LUMPECTOMY WITH RADIOACTIVE SEED AND SENTINEL LYMPH NODE BIOPSY Right 03/17/2016   Procedure: BREAST LUMPECTOMY WITH RADIOACTIVE SEED AND SENTINEL LYMPH NODE BIOPSY;  Surgeon: Robyn Messing III, MD;  Location: [place];  Service: General;  Laterality: Right;  BREAST LUMPECTOMY WITH RADIOACTIVE SEED AND SENTINEL LYMPH NODE BIOPSY  . COLONOSCOPY  multiple  . Excision of Melanoma  on back    . TONSILLECTOMY     and adenoidectomy  . VAGINAL  HYSTERECTOMY  2001   Social History   Social History Narrative   Married to Robyn Hobbs   never smoker, rare alcohol no drug use   Originally from [place]   family history includes Colon cancer in her maternal grandmother, paternal grandfather, and paternal grandmother; Kidney disease in her father.   Review of Systems   Objective:   Physical Exam

## 2019-12-16 DIAGNOSIS — H903 Sensorineural hearing loss, bilateral: Secondary | ICD-10-CM | POA: Diagnosis not present

## 2019-12-16 DIAGNOSIS — H9121 Sudden idiopathic hearing loss, right ear: Secondary | ICD-10-CM | POA: Diagnosis not present

## 2019-12-16 DIAGNOSIS — H6121 Impacted cerumen, right ear: Secondary | ICD-10-CM | POA: Diagnosis not present

## 2019-12-21 ENCOUNTER — Other Ambulatory Visit: Payer: Self-pay | Admitting: Oncology

## 2020-01-02 DIAGNOSIS — H903 Sensorineural hearing loss, bilateral: Secondary | ICD-10-CM | POA: Diagnosis not present

## 2020-01-02 DIAGNOSIS — H6122 Impacted cerumen, left ear: Secondary | ICD-10-CM | POA: Diagnosis not present

## 2020-01-07 ENCOUNTER — Other Ambulatory Visit: Payer: Self-pay | Admitting: Internal Medicine

## 2020-01-07 DIAGNOSIS — L570 Actinic keratosis: Secondary | ICD-10-CM | POA: Diagnosis not present

## 2020-01-07 DIAGNOSIS — L821 Other seborrheic keratosis: Secondary | ICD-10-CM | POA: Diagnosis not present

## 2020-01-07 DIAGNOSIS — Z85828 Personal history of other malignant neoplasm of skin: Secondary | ICD-10-CM | POA: Diagnosis not present

## 2020-01-07 DIAGNOSIS — D485 Neoplasm of uncertain behavior of skin: Secondary | ICD-10-CM | POA: Diagnosis not present

## 2020-01-07 MED ORDER — BUDESONIDE 3 MG PO CPEP: 9.0000 mg | ORAL_CAPSULE | Freq: Every day | ORAL | 1 refills | Status: DC

## 2020-02-07 ENCOUNTER — Other Ambulatory Visit: Payer: Self-pay | Admitting: Oncology

## 2020-02-07 DIAGNOSIS — Z9889 Other specified postprocedural states: Secondary | ICD-10-CM

## 2020-02-07 DIAGNOSIS — Z1231 Encounter for screening mammogram for malignant neoplasm of breast: Secondary | ICD-10-CM

## 2020-02-25 DIAGNOSIS — R69 Illness, unspecified: Secondary | ICD-10-CM | POA: Diagnosis not present

## 2020-03-02 ENCOUNTER — Other Ambulatory Visit: Payer: Self-pay | Admitting: Oncology

## 2020-03-02 DIAGNOSIS — Z9889 Other specified postprocedural states: Secondary | ICD-10-CM

## 2020-03-23 ENCOUNTER — Other Ambulatory Visit: Payer: Self-pay | Admitting: Oncology

## 2020-03-29 ENCOUNTER — Other Ambulatory Visit: Payer: Self-pay | Admitting: Oncology

## 2020-03-31 ENCOUNTER — Other Ambulatory Visit: Payer: Self-pay

## 2020-03-31 ENCOUNTER — Ambulatory Visit
Admission: RE | Admit: 2020-03-31 | Discharge: 2020-03-31 | Disposition: A | Discharge status: Home / Self Care | Payer: Medicare HMO | Source: Ambulatory Visit | Attending: Oncology | Admitting: Oncology

## 2020-03-31 DIAGNOSIS — Z9889 Other specified postprocedural states: Secondary | ICD-10-CM

## 2020-03-31 DIAGNOSIS — Z853 Personal history of malignant neoplasm of breast: Secondary | ICD-10-CM | POA: Diagnosis not present

## 2020-05-11 DIAGNOSIS — L821 Other seborrheic keratosis: Secondary | ICD-10-CM | POA: Diagnosis not present

## 2020-05-11 DIAGNOSIS — Z85828 Personal history of other malignant neoplasm of skin: Secondary | ICD-10-CM | POA: Diagnosis not present

## 2020-05-11 DIAGNOSIS — D225 Melanocytic nevi of trunk: Secondary | ICD-10-CM | POA: Diagnosis not present

## 2020-05-11 DIAGNOSIS — L700 Acne vulgaris: Secondary | ICD-10-CM | POA: Diagnosis not present

## 2020-05-18 ENCOUNTER — Other Ambulatory Visit: Payer: Self-pay | Admitting: *Deleted

## 2020-05-18 DIAGNOSIS — C50211 Malignant neoplasm of upper-inner quadrant of right female breast: Secondary | ICD-10-CM

## 2020-05-18 DIAGNOSIS — Z170 Estrogen receptor positive status [ER+]: Secondary | ICD-10-CM

## 2020-05-19 ENCOUNTER — Inpatient Hospital Stay: Payer: Medicare HMO

## 2020-05-19 ENCOUNTER — Inpatient Hospital Stay: Payer: Medicare HMO | Attending: Oncology | Admitting: Oncology

## 2020-05-19 ENCOUNTER — Other Ambulatory Visit: Payer: Self-pay

## 2020-05-19 VITALS — BP 115/67 | HR 67 | Temp 97.5°F | Resp 18 | Ht 64.5 in | Wt 123.1 lb

## 2020-05-19 DIAGNOSIS — K589 Irritable bowel syndrome without diarrhea: Secondary | ICD-10-CM | POA: Insufficient documentation

## 2020-05-19 DIAGNOSIS — Z79810 Long term (current) use of selective estrogen receptor modulators (SERMs): Secondary | ICD-10-CM | POA: Insufficient documentation

## 2020-05-19 DIAGNOSIS — M85859 Other specified disorders of bone density and structure, unspecified thigh: Secondary | ICD-10-CM | POA: Diagnosis not present

## 2020-05-19 DIAGNOSIS — Z923 Personal history of irradiation: Secondary | ICD-10-CM | POA: Insufficient documentation

## 2020-05-19 DIAGNOSIS — K581 Irritable bowel syndrome with constipation: Secondary | ICD-10-CM | POA: Diagnosis not present

## 2020-05-19 DIAGNOSIS — Z7289 Other problems related to lifestyle: Secondary | ICD-10-CM | POA: Diagnosis not present

## 2020-05-19 DIAGNOSIS — Z841 Family history of disorders of kidney and ureter: Secondary | ICD-10-CM | POA: Diagnosis not present

## 2020-05-19 DIAGNOSIS — Z885 Allergy status to narcotic agent status: Secondary | ICD-10-CM | POA: Insufficient documentation

## 2020-05-19 DIAGNOSIS — M8580 Other specified disorders of bone density and structure, unspecified site: Secondary | ICD-10-CM

## 2020-05-19 DIAGNOSIS — Z85820 Personal history of malignant melanoma of skin: Secondary | ICD-10-CM | POA: Diagnosis not present

## 2020-05-19 DIAGNOSIS — Z170 Estrogen receptor positive status [ER+]: Secondary | ICD-10-CM

## 2020-05-19 DIAGNOSIS — Z801 Family history of malignant neoplasm of trachea, bronchus and lung: Secondary | ICD-10-CM | POA: Insufficient documentation

## 2020-05-19 DIAGNOSIS — Z79899 Other long term (current) drug therapy: Secondary | ICD-10-CM | POA: Diagnosis not present

## 2020-05-19 DIAGNOSIS — Z800 Family history of malignant neoplasm of digestive organs: Secondary | ICD-10-CM | POA: Diagnosis not present

## 2020-05-19 DIAGNOSIS — K52839 Microscopic colitis, unspecified: Secondary | ICD-10-CM | POA: Diagnosis not present

## 2020-05-19 DIAGNOSIS — C50211 Malignant neoplasm of upper-inner quadrant of right female breast: Secondary | ICD-10-CM

## 2020-05-19 DIAGNOSIS — Z853 Personal history of malignant neoplasm of breast: Secondary | ICD-10-CM | POA: Diagnosis not present

## 2020-05-19 DIAGNOSIS — Z881 Allergy status to other antibiotic agents status: Secondary | ICD-10-CM | POA: Diagnosis not present

## 2020-05-19 DIAGNOSIS — Z90722 Acquired absence of ovaries, bilateral: Secondary | ICD-10-CM | POA: Insufficient documentation

## 2020-05-19 LAB — CBC WITH DIFFERENTIAL (CANCER CENTER ONLY)
Abs Immature Granulocytes: 0.02 10*3/uL (ref 0.00–0.07)
Basophils Absolute: 0 10*3/uL (ref 0.0–0.1)
Basophils Relative: 0 %
Eosinophils Absolute: 0.1 10*3/uL (ref 0.0–0.5)
Eosinophils Relative: 2 %
HCT: 41 % (ref 36.0–46.0)
Hemoglobin: 13.3 g/dL (ref 12.0–15.0)
Immature Granulocytes: 0 %
Lymphocytes Relative: 21 %
Lymphs Abs: 2 10*3/uL (ref 0.7–4.0)
MCH: 28.1 pg (ref 26.0–34.0)
MCHC: 32.4 g/dL (ref 30.0–36.0)
MCV: 86.7 fL (ref 80.0–100.0)
Monocytes Absolute: 0.6 10*3/uL (ref 0.1–1.0)
Monocytes Relative: 6 %
Neutro Abs: 6.8 10*3/uL (ref 1.7–7.7)
Neutrophils Relative %: 71 %
Platelet Count: 220 10*3/uL (ref 150–400)
RBC: 4.73 MIL/uL (ref 3.87–5.11)
RDW: 13 % (ref 11.5–15.5)
WBC Count: 9.5 10*3/uL (ref 4.0–10.5)
nRBC: 0 % (ref 0.0–0.2)

## 2020-05-19 LAB — CMP (CANCER CENTER ONLY)
ALT: 19 U/L (ref 0–44)
AST: 20 U/L (ref 15–41)
Albumin: 4.1 g/dL (ref 3.5–5.0)
Alkaline Phosphatase: 47 U/L (ref 38–126)
Anion gap: 7 (ref 5–15)
BUN: 18 mg/dL (ref 8–23)
CO2: 28 mmol/L (ref 22–32)
Calcium: 9.5 mg/dL (ref 8.9–10.3)
Chloride: 103 mmol/L (ref 98–111)
Creatinine: 0.94 mg/dL (ref 0.44–1.00)
GFR, Estimated: >60 mL/min (ref >60)
Glucose, Bld: 87 mg/dL (ref 70–99)
Potassium: 4.2 mmol/L (ref 3.5–5.1)
Sodium: 138 mmol/L (ref 135–145)
Total Bilirubin: 0.5 mg/dL (ref 0.3–1.2)
Total Protein: 6.5 g/dL (ref 6.5–8.1)

## 2020-05-19 NOTE — Progress Notes (Signed)
[place]  Telephone:([phone]) [phone] Fax:(336) [phone]     ID: Robyn Hobbs DOB: 1950/08/02  MR#: [id number]  ERD#:[id number]  Patient Care Team: Karlene Einstein, MD as PCP - General (Family Medicine) Jovita Kussmaul, MD as Consulting Physician (General Surgery) Natasa Stigall, Virgie Dad, MD as Consulting Physician (Oncology) Eppie Gibson, MD as Attending Physician (Radiation Oncology) Martinique, Amy, MD as Consulting Physician (Dermatology) Gatha Mayer, MD as Consulting Physician (Gastroenterology) OTHER MD:  CHIEF COMPLAINT: Estrogen receptor positive breast cancer  CURRENT TREATMENT: Tamoxifen   INTERVAL HISTORY: Robyn Hobbs returns today for follow-up of her estrogen receptor positive breast cancer.   She continues on tamoxifen, with good tolerance.  Currently neither hot flashes nor vaginal wetness or issues of concern  Since her last visit, she underwent bilateral diagnostic mammography with tomography at [facility] on 03/31/2020 showing: breast density category C; no evidence of malignancy in either breast.   She also underwent CT abdomen/pelvis on 07/25/2019 to further evaluate her continued GI issues. CT was negative.   REVIEW OF SYSTEMS: Robyn Hobbs continues to have problems with her bowels.  She has been extensively worked by Dr. Carlean Purl.  She did apparently improve on budesonide but it tended to constipate her.  She now takes the budesonide very regularly.  She is trying to figure out if this is a diet related issue.  She is exercising regularly with a trainer 3 times a week and walking at least 545 minutes at a time.  A detailed review of systems today was otherwise stable.   BREAST CANCER HISTORY: From the original intake note:  Robyn Hobbs had screening mammography suggesting a possible mass in the right breast and [facility] diagnostic mammography with tomography and right breast ultrasonography was performed 02/17/2016. The breast  density was category C. There was set up mass in the posterior third of the upper central right breast which was not palpable by exam. Ultrasonography confirmed an irregular hypoechoic mass measuring approximately 0.9 cm. Survey of the right axilla was unremarkable.  Biopsy of the right breast mass in question 02/19/2016 showed ([id number]) an invasive ductal carcinoma, grade 1, estrogen receptor 100% positive, with strong staining intensity, progesterone receptor negative, with an MIB-1 of 2%, and no HER-2 amplification, the signals ratio being 1.00 and the number per cell 1.05.  Her subsequent history is as detailed below.   PAST MEDICAL HISTORY: Past Medical History:  Diagnosis Date  . Allergy   . Arthritis   . Breast cancer ([place])   . Breast cancer of upper-inner quadrant of right female breast ([place]) 02/25/2016  . Cataract   . Collagenous colitis   . History of radiation therapy 05/18/16- 06/08/16   Right Breast 42.56 Gy in 16 fractions.   . Increased liver enzymes   . Osteopenia   . Personal history of radiation therapy   . Small intestinal bacterial overgrowth 03/21/2019   44 ppm increase H2, 13 ppm methane 57 ppm total lactulose breath test   . Small intestinal bacterial overgrowth     PAST SURGICAL HISTORY: Past Surgical History:  Procedure Laterality Date  . BLADDER REPAIR    . BREAST BIOPSY    . BREAST LUMPECTOMY    . BREAST LUMPECTOMY WITH RADIOACTIVE SEED AND SENTINEL LYMPH NODE BIOPSY Right 03/17/2016   Procedure: BREAST LUMPECTOMY WITH RADIOACTIVE SEED AND SENTINEL LYMPH NODE BIOPSY;  Surgeon: Autumn Messing III, MD;  Location: [facility];  Service: General;  Laterality:  Right;  BREAST LUMPECTOMY WITH RADIOACTIVE SEED AND SENTINEL LYMPH NODE BIOPSY  . COLONOSCOPY  multiple  . Excision of Melanoma  on back    . TONSILLECTOMY     and adenoidectomy  . VAGINAL HYSTERECTOMY  2001    FAMILY HISTORY Family History  Problem Relation Age of Onset  . Kidney  disease Father   . Colon cancer Maternal Grandmother   . Colon cancer Paternal Grandfather   . Colon cancer Paternal Grandmother   . Esophageal cancer Neg Hx   . Liver cancer Neg Hx   . Pancreatic cancer Neg Hx   . Rectal cancer Neg Hx   . Stomach cancer Neg Hx   . Breast cancer Neg Hx   The patient's father died at the age of 71 from kidney cancer. The patient's mother died at the age of 56. The patient had one brother, no sisters. On the mother's side both the grandmother and grandfather had colon cancer diagnosed in their 57s. The patient's brother had prostate cancer at age 14. The patient's paternal grandfather was diagnosed with lung cancer in his late 40s. There is no history of breast or ovarian cancer in the family   GYNECOLOGIC HISTORY:  No LMP recorded. Patient is postmenopausal. Menarche age 87, first live birth age 60, the patient is Robyn Hobbs. She underwent full abdominal hysterectomy with bilateral salpingo-oophorectomy June 2001. She took hormone replacement for less than a year, stopping in 2002. She used oral contraceptives for approximately 5 years remotely without complications.   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 Her husband Robyn Bowl") used to work in [employer] but is now retired. Son Robyn Hobbs lives in [place] and is a Hydrologist for [employer], son Robyn Hobbs lives in [place] and runs [employer] that imports [employer] technologist, [name] Lives in [place] He Works in [employer]. The Patient Has 6 Grandchildren. She Attends a [employer].    ADVANCED DIRECTIVES: In place   HEALTH MAINTENANCE: Social History   Tobacco Use  . Smoking status: Never Smoker  . Smokeless tobacco: Never Used  Vaping Use  . Vaping Use: Never used  Substance Use Topics  . Alcohol use: Yes    Alcohol/week: 3.0 standard drinks    Types: 3 Glasses of wine per week    Comment: she is not drinking at this time.   .  Drug use: No     Colonoscopy: 2014/ Dr. Carlean Purl  PAP:  Bone density: July 2017   Allergies  Allergen Reactions  . Morphine And Related Swelling  . Sulfamethoxazole-Trimethoprim Other (See Comments)    Pt was prescribed 5 days of Bactrim DS , only able to tolerate 2 days of meds due to nausea and general malaise     Current Outpatient Medications  Medication Sig Dispense Refill  . beta carotene w/minerals (OCUVITE) tablet Take 1 tablet by mouth daily.    . budesonide (ENTOCORT EC) 3 MG 24 hr capsule Take 3 capsules (9 mg total) by mouth daily. 90 capsule 1  . cholecalciferol (VITAMIN D) 1000 UNITS tablet Take 2,000 Units by mouth daily.    . Cranberry 1000 MG CAPS Take 1 capsule by mouth daily.    . tamoxifen (NOLVADEX) 20 MG tablet TAKE 1 TABLET(20 MG) BY MOUTH DAILY 90 tablet 0   No current facility-administered medications for this visit.    OBJECTIVE: White woman who appears younger than stated age  37:   05/19/20 1341  BP: 115/67  Pulse: 67  Resp: 18  Temp: (!) 97.5 F (36.4 C)  SpO2: 100%     Body mass index is 20.8 kg/m.    ECOG FS:0 - Asymptomatic  Sclerae unicteric, EOMs intact Wearing a mask No cervical or supraclavicular adenopathy Lungs no rales or rhonchi Heart regular rate and rhythm Abd soft, nontender, positive bowel sounds MSK no focal spinal tenderness, no upper extremity lymphedema Neuro: nonfocal, well oriented, appropriate affect Breasts: The right breast is status post lumpectomy and radiation.  There is no evidence of local recurrence.  Both axillae are benign.   LAB RESULTS:  CMP     Component Value Date/Time   NA 138 07/11/2019 1007   NA 141 05/02/2017 1500   K 4.3 07/11/2019 1007   K 4.1 05/02/2017 1500   CL 100 07/11/2019 1007   CO2 29 07/11/2019 1007   CO2 28 05/02/2017 1500   GLUCOSE 100 (H) 07/11/2019 1007   GLUCOSE 77 05/02/2017 1500   BUN 17 07/11/2019 1007   BUN 19.0 05/02/2017 1500   CREATININE 0.94 07/11/2019 1007     CREATININE 0.9 05/02/2017 1500   CALCIUM 9.6 07/11/2019 1007   CALCIUM 9.6 05/02/2017 1500   PROT 6.9 07/11/2019 1007   PROT 7.0 05/02/2017 1500   ALBUMIN 4.5 07/11/2019 1007   ALBUMIN 4.3 05/02/2017 1500   AST 23 07/11/2019 1007   AST 26 05/02/2017 1500   ALT 23 07/11/2019 1007   ALT 29 05/02/2017 1500   ALKPHOS 40 07/11/2019 1007   ALKPHOS 39 (L) 05/02/2017 1500   BILITOT 0.4 07/11/2019 1007   BILITOT 0.62 05/02/2017 1500   GFRNONAA >60 05/17/2019 1322   GFRAA >60 05/17/2019 1322    INo results found for: SPEP, UPEP  Lab Results  Component Value Date   WBC 9.5 05/19/2020   NEUTROABS 6.8 05/19/2020   HGB 13.3 05/19/2020   HCT 41.0 05/19/2020   MCV 86.7 05/19/2020   PLT 220 05/19/2020      Chemistry      Component Value Date/Time   NA 138 07/11/2019 1007   NA 141 05/02/2017 1500   K 4.3 07/11/2019 1007   K 4.1 05/02/2017 1500   CL 100 07/11/2019 1007   CO2 29 07/11/2019 1007   CO2 28 05/02/2017 1500   BUN 17 07/11/2019 1007   BUN 19.0 05/02/2017 1500   CREATININE 0.94 07/11/2019 1007   CREATININE 0.9 05/02/2017 1500      Component Value Date/Time   CALCIUM 9.6 07/11/2019 1007   CALCIUM 9.6 05/02/2017 1500   ALKPHOS 40 07/11/2019 1007   ALKPHOS 39 (L) 05/02/2017 1500   AST 23 07/11/2019 1007   AST 26 05/02/2017 1500   ALT 23 07/11/2019 1007   ALT 29 05/02/2017 1500   BILITOT 0.4 07/11/2019 1007   BILITOT 0.62 05/02/2017 1500       No results found for: LABCA2  No components found for: LABCA125  No results for input(s): INR in the last 168 hours.  Urinalysis No results found for: COLORURINE, APPEARANCEUR, LABSPEC, PHURINE, GLUCOSEU, HGBUR, BILIRUBINUR, KETONESUR, PROTEINUR, UROBILINOGEN, NITRITE, LEUKOCYTESUR   STUDIES: No results found.   ELIGIBLE FOR AVAILABLE RESEARCH PROTOCOL: no  ASSESSMENT: 69 y.o. Robyn Hobbs, [place] woman status post right breast upper inner quadrant biopsy 02/19/2016 for a clinical T1b N0, clinical stage IA invasive  ductal carcinoma, grade 1, estrogen receptor positive, progesterone receptor and HER-2 negative, with an MIB-1 of 2.   (1) status post right lumpectomy and sentinel  lymph node sampling 05/17/2016 for a pT1c pN0, stage IA invasive ductal carcinoma, grade 1, with negative margins  (2) Oncotype DX score of 20 predicts a 10 year risk of recurrence outside the breast of 13% if the patient's only systemic treatment is tamoxifen for 5 years  (a) given the marginal predicted benefit from chemotherapy, the patient opted against that  (3) adjuvant radiation 05/18/16 - 06/08/16:  Right breast: 42.56 Gy in 16 fractions.  (4) started tamoxifen 07/25/2016   PLAN: Robyn Hobbs is now a little over 4 years out from definitive surgery for her breast cancer with no evidence of disease recurrence.  This is very favorable.  She is tolerating tamoxifen well and the plan will be to continue that 1 more year after which she will be ready to "graduate".  I again discussed the bowel flora issue with her and while last year I suggested that she try chickpea flour and soy being flour I think what she is going to try this time is to just eat some garbanzo beans in her salad, some tofu here and there, and some greenish bananas and peanuts and save that can feed the right bacteria in her gut and perhaps get some improvement.  She will be seeing a specialist in IBS in [place] and perhaps she will get some help from that.  Overall though at present her weight is stable.  I suggested she on one column right everything that she ate on the day and in very separate column right how many bowel movements she had that day and what quality they were.  Possibly she can develop a correlation that way.  She knows to call for any other issues that may develop before the next visit  Total encounter time 25 minutes.*    Jamelle Goldston, Virgie Dad, MD  05/19/20 2:06 PM Medical Oncology and Hematology [facility] [place], [place] [address] Tel. [phone]    Fax. [phone]   I, Wilburn Mylar, am acting as scribe for Dr. Virgie Dad. Kierria Feigenbaum.  I, Lurline Del MD, have reviewed the above documentation for accuracy and completeness, and I agree with the above.   *Total Encounter Time as defined by the Centers for Medicare and Medicaid Services includes, in addition to the face-to-face time of a patient visit (documented in the note above) non-face-to-face time: obtaining and reviewing outside history, ordering and reviewing medications, tests or procedures, care coordination (communications with other health care professionals or caregivers) and documentation in the medical record.

## 2020-05-25 DIAGNOSIS — R159 Full incontinence of feces: Secondary | ICD-10-CM | POA: Diagnosis not present

## 2020-05-25 DIAGNOSIS — Z882 Allergy status to sulfonamides status: Secondary | ICD-10-CM | POA: Diagnosis not present

## 2020-05-25 DIAGNOSIS — Z853 Personal history of malignant neoplasm of breast: Secondary | ICD-10-CM | POA: Diagnosis not present

## 2020-05-25 DIAGNOSIS — R197 Diarrhea, unspecified: Secondary | ICD-10-CM | POA: Diagnosis not present

## 2020-05-25 DIAGNOSIS — K52831 Collagenous colitis: Secondary | ICD-10-CM | POA: Diagnosis not present

## 2020-05-25 DIAGNOSIS — K5900 Constipation, unspecified: Secondary | ICD-10-CM | POA: Diagnosis not present

## 2020-05-25 DIAGNOSIS — Z79810 Long term (current) use of selective estrogen receptor modulators (SERMs): Secondary | ICD-10-CM | POA: Diagnosis not present

## 2020-05-25 DIAGNOSIS — Z885 Allergy status to narcotic agent status: Secondary | ICD-10-CM | POA: Diagnosis not present

## 2020-06-22 DIAGNOSIS — K6289 Other specified diseases of anus and rectum: Secondary | ICD-10-CM | POA: Diagnosis not present

## 2020-06-22 DIAGNOSIS — Z90710 Acquired absence of both cervix and uterus: Secondary | ICD-10-CM | POA: Diagnosis not present

## 2020-06-22 DIAGNOSIS — R159 Full incontinence of feces: Secondary | ICD-10-CM | POA: Diagnosis not present

## 2020-06-22 DIAGNOSIS — R197 Diarrhea, unspecified: Secondary | ICD-10-CM | POA: Diagnosis not present

## 2020-07-01 ENCOUNTER — Telehealth: Payer: Self-pay | Admitting: Internal Medicine

## 2020-07-01 NOTE — Telephone Encounter (Signed)
Please ask her to get copies of her anal ultrasound and anorectal manometry reports if she can.  We can have her sign a release of records if we need to do it that way.  I cannot see those in care everywhere.

## 2020-07-01 NOTE — Telephone Encounter (Signed)
I will see her 

## 2020-07-01 NOTE — Telephone Encounter (Signed)
Good morning Dr. Carlean Purl, this patient was a patient of yours.  Recently went to another GI practice but would like to transfer back to [place].  Her records are in epic.  Can you please review and advise on scheduling.  Thank you.

## 2020-07-01 NOTE — Telephone Encounter (Signed)
Spoke with patient and scheduled her for 08/25/2020.  Will also fax over records for Dr. Carlean Purl to review.

## 2020-07-21 IMAGING — MG DIGITAL DIAGNOSTIC BILATERAL MAMMOGRAM WITH TOMO AND CAD
9 series · 9 of 25 positions shown · non-contrast
Comparison: Previous exam(s).

CLINICAL DATA: 67-year-old female status post malignant right
lumpectomy in 5150 with radiation therapy. No current symptoms.

EXAM:
DIGITAL DIAGNOSTIC BILATERAL MAMMOGRAM WITH CAD AND TOMO

[R MLO]
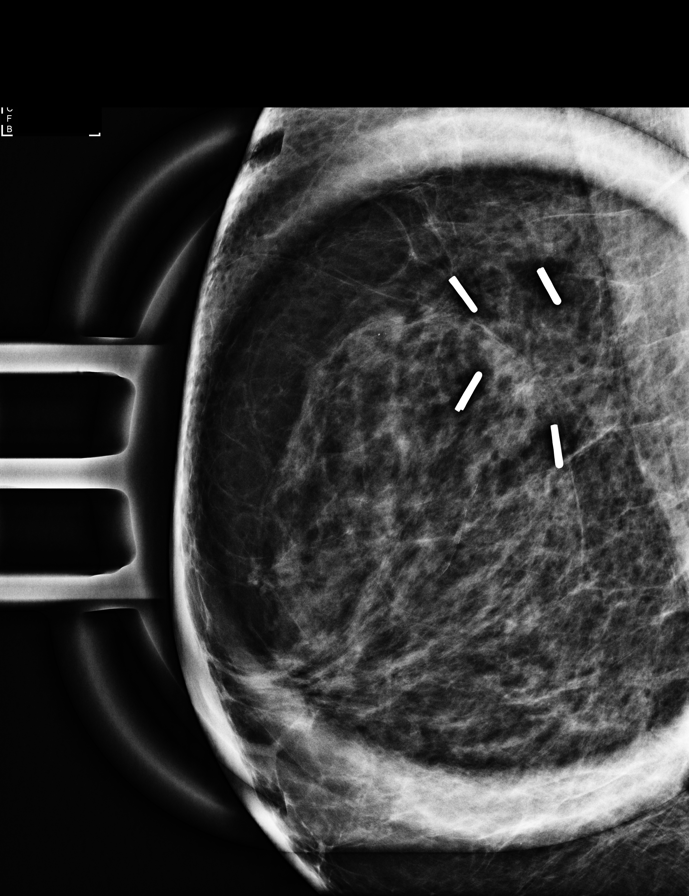

[R MLO synth-2D]
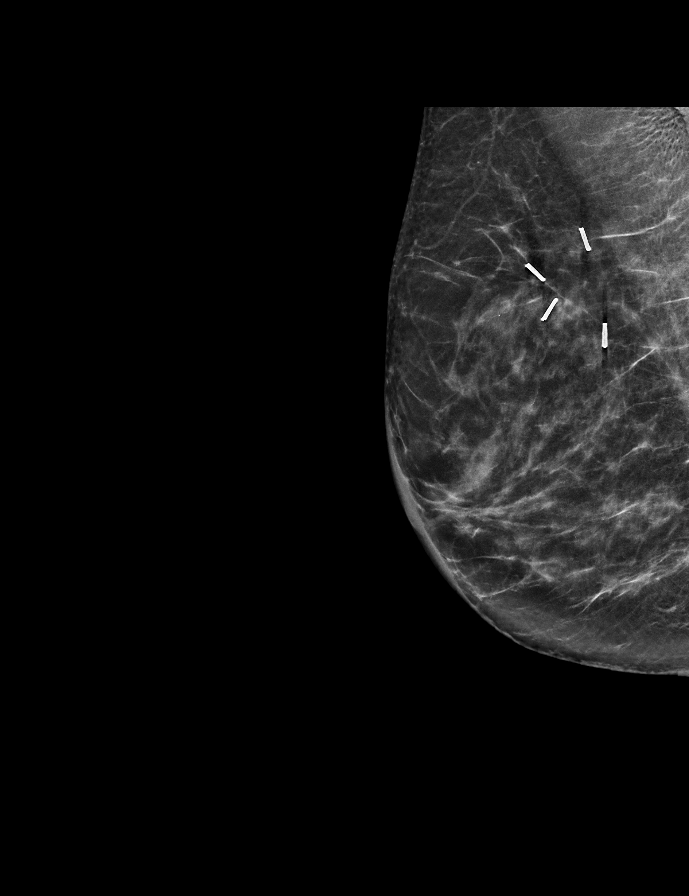

[L CC synth-2D]
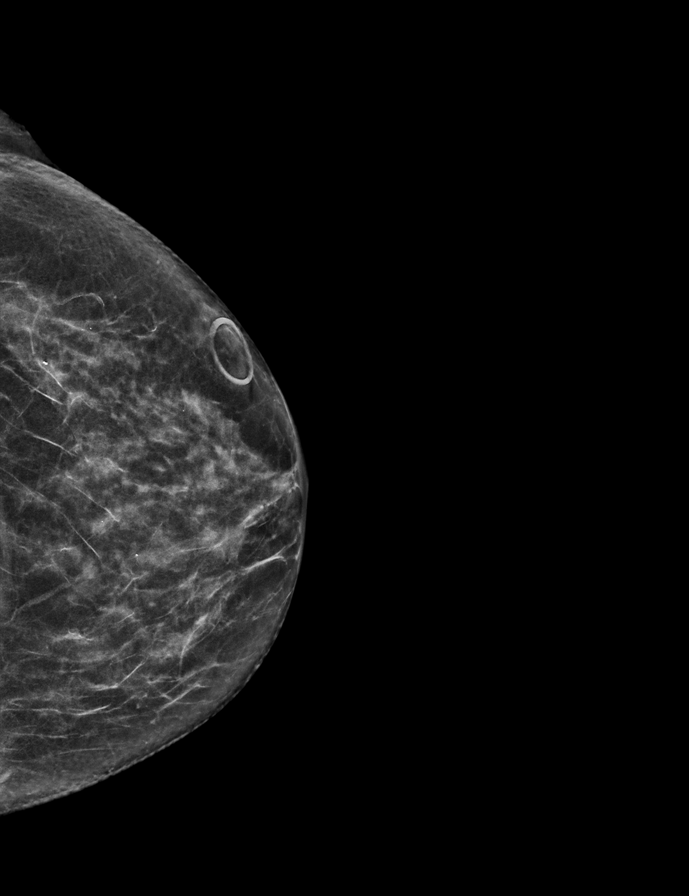

[R CC synth-2D]
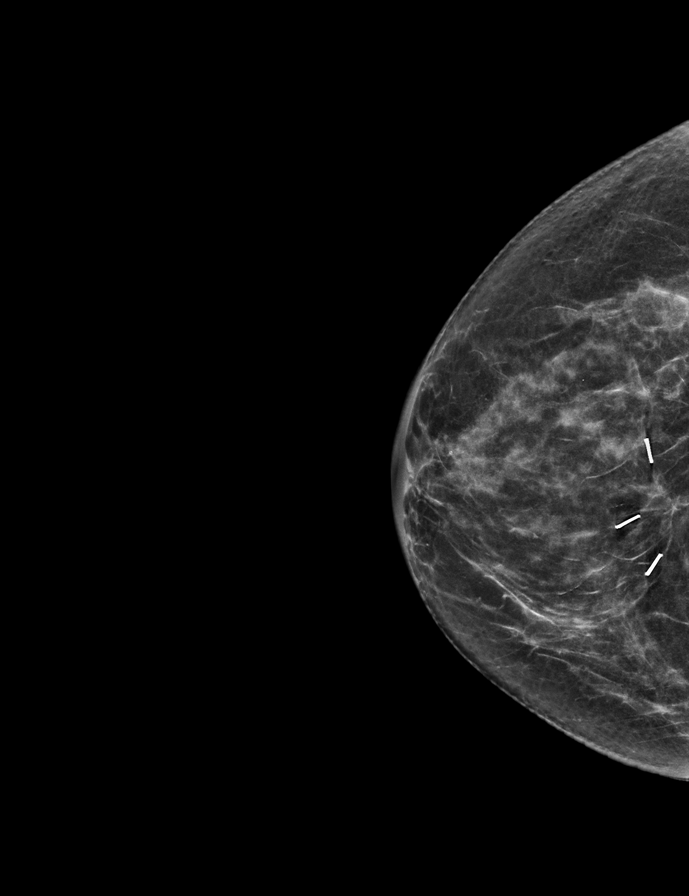

[L MLO synth-2D]
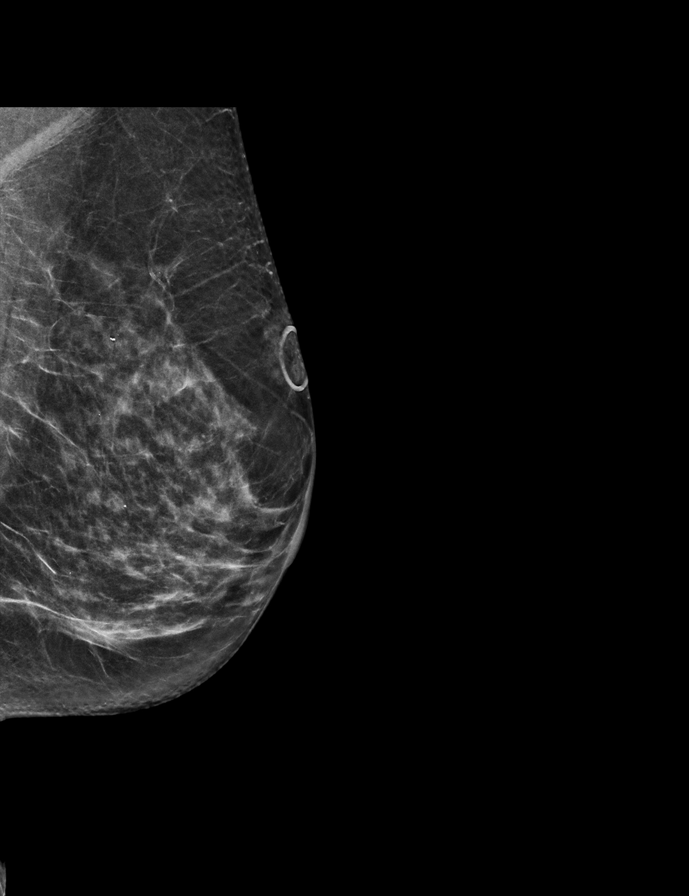

[L CC tomo · tomo slice 30/59.0]
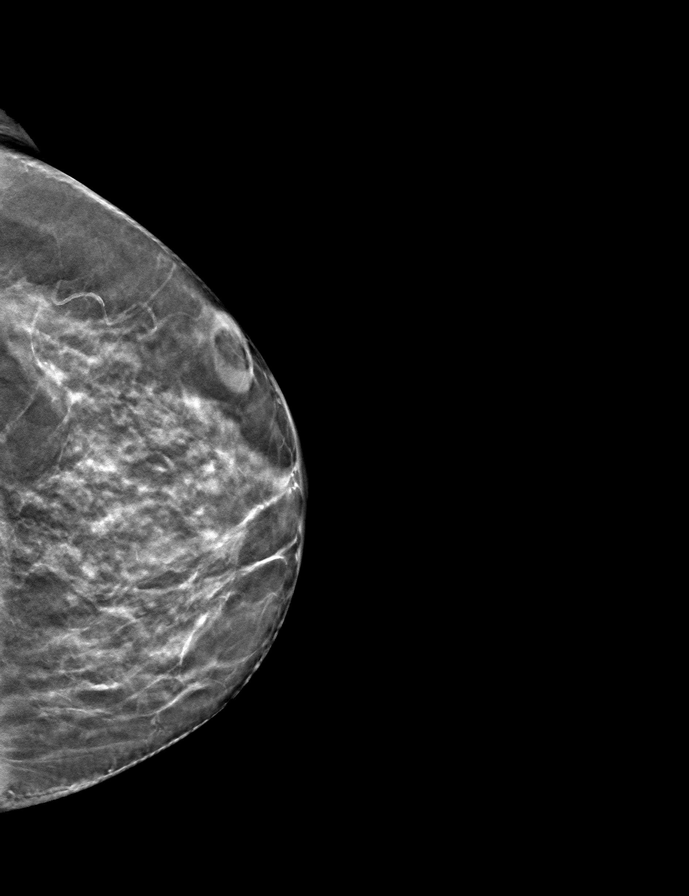

[L MLO tomo · tomo slice 29/58.0]
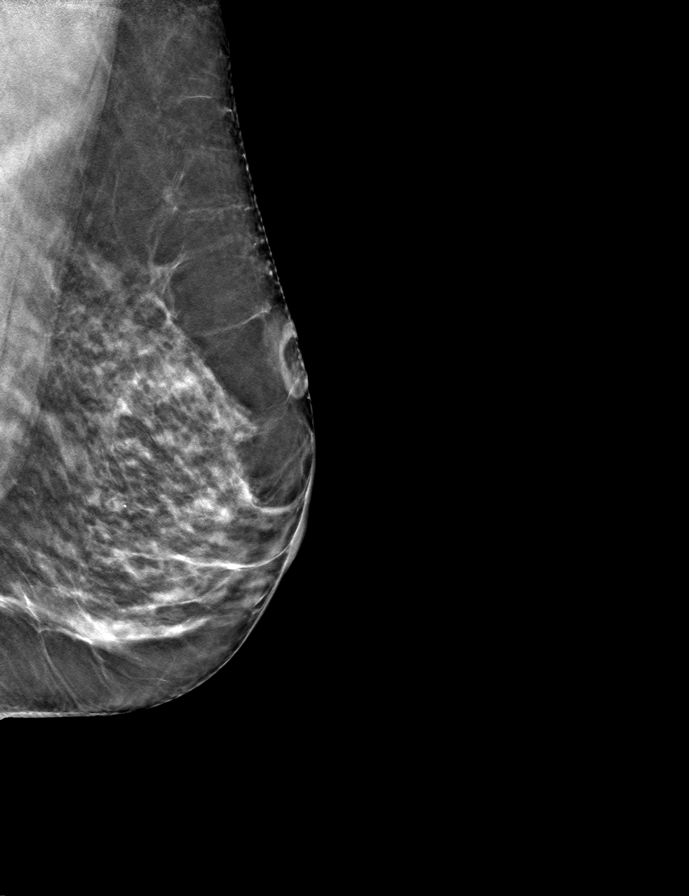

[R CC tomo · tomo slice 31/60.0]
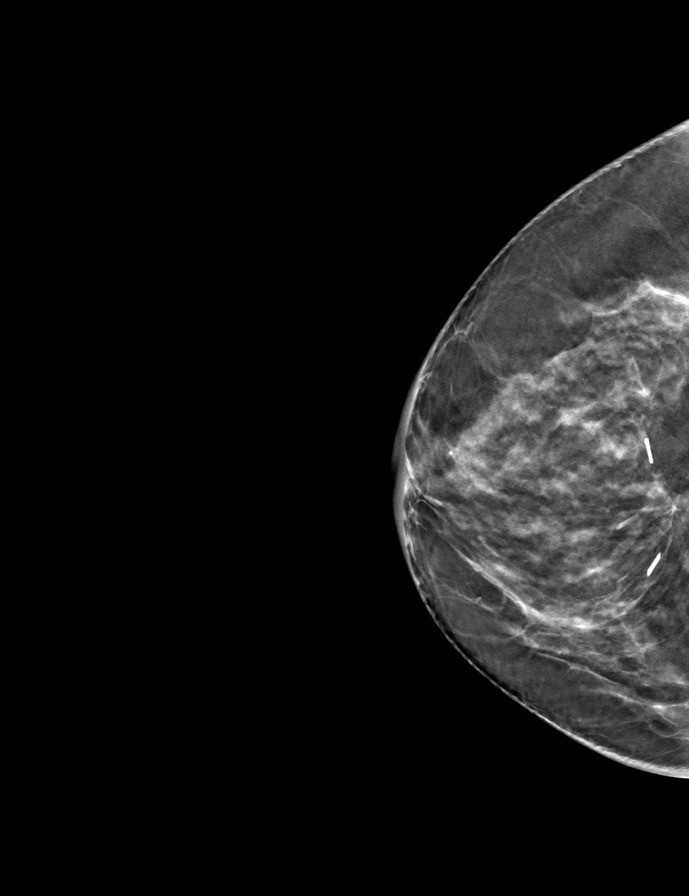

[R MLO tomo · tomo slice 31/61.0]
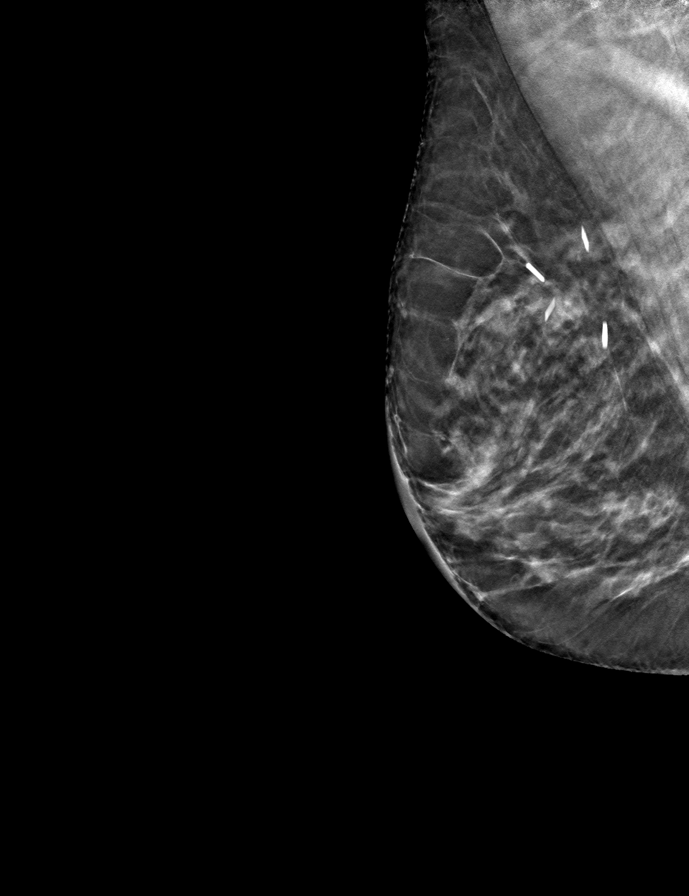

[9 of 25 positions shown; findings below may reference images not displayed]

ACR Breast Density Category c: The breast tissue is heterogeneously
dense, which may obscure small masses.
FINDINGS: Stable posttreatment changes are noted on the right. No new or
suspicious mammographic findings are identified in either breast.

Mammographic images were processed with CAD.
IMPRESSION: 1. No mammographic evidence of malignancy in either breast.
2. Stable right breast posttreatment changes.

RECOMMENDATION:
Diagnostic mammogram is suggested in 1 year. (Code:[id number]-0AQ)

I have discussed the findings and recommendations with the patient.
Results were also provided in writing at the conclusion of the
visit. If applicable, a reminder letter will be sent to the patient
regarding the next appointment.

BI-RADS CATEGORY  2: Benign.

## 2020-07-27 DIAGNOSIS — Z23 Encounter for immunization: Secondary | ICD-10-CM | POA: Diagnosis not present

## 2020-07-27 DIAGNOSIS — Z90710 Acquired absence of both cervix and uterus: Secondary | ICD-10-CM | POA: Diagnosis not present

## 2020-07-27 DIAGNOSIS — Z131 Encounter for screening for diabetes mellitus: Secondary | ICD-10-CM | POA: Diagnosis not present

## 2020-07-27 DIAGNOSIS — Z13220 Encounter for screening for lipoid disorders: Secondary | ICD-10-CM | POA: Diagnosis not present

## 2020-07-27 DIAGNOSIS — M85859 Other specified disorders of bone density and structure, unspecified thigh: Secondary | ICD-10-CM | POA: Diagnosis not present

## 2020-07-27 DIAGNOSIS — Z7689 Persons encountering health services in other specified circumstances: Secondary | ICD-10-CM | POA: Diagnosis not present

## 2020-07-27 DIAGNOSIS — Z90721 Acquired absence of ovaries, unilateral: Secondary | ICD-10-CM | POA: Diagnosis not present

## 2020-07-27 DIAGNOSIS — N390 Urinary tract infection, site not specified: Secondary | ICD-10-CM | POA: Diagnosis not present

## 2020-07-27 DIAGNOSIS — Z79899 Other long term (current) drug therapy: Secondary | ICD-10-CM | POA: Diagnosis not present

## 2020-07-27 DIAGNOSIS — Z0000 Encounter for general adult medical examination without abnormal findings: Secondary | ICD-10-CM | POA: Diagnosis not present

## 2020-07-27 DIAGNOSIS — Z1159 Encounter for screening for other viral diseases: Secondary | ICD-10-CM | POA: Diagnosis not present

## 2020-08-25 ENCOUNTER — Other Ambulatory Visit: Payer: Self-pay

## 2020-08-25 ENCOUNTER — Encounter: Payer: Self-pay | Admitting: Internal Medicine

## 2020-08-25 ENCOUNTER — Ambulatory Visit: Payer: Medicare HMO | Admitting: Internal Medicine

## 2020-08-25 VITALS — BP 122/64 | HR 67 | Ht 65.0 in | Wt 124.6 lb

## 2020-08-25 DIAGNOSIS — K5902 Outlet dysfunction constipation: Secondary | ICD-10-CM | POA: Diagnosis not present

## 2020-08-25 DIAGNOSIS — K52831 Collagenous colitis: Secondary | ICD-10-CM | POA: Diagnosis not present

## 2020-08-25 DIAGNOSIS — K582 Mixed irritable bowel syndrome: Secondary | ICD-10-CM | POA: Diagnosis not present

## 2020-08-25 DIAGNOSIS — K6289 Other specified diseases of anus and rectum: Secondary | ICD-10-CM | POA: Diagnosis not present

## 2020-08-25 NOTE — Progress Notes (Signed)
Robyn Hobbs 71 y.o. May 19, 1951 [id number]  Assessment & Plan:   Encounter Diagnoses  Name Primary?  Marland Kitchen Anal sphincter incompetence Yes  . Dyssynergic defecation   . Irritable bowel syndrome with both constipation and diarrhea   . Collagenous colitis    There is really nothing new here and she is aware of that.  Though she did not have manometry before we knew from physical exam she had anal sphincter incompetence.  She has performed physical therapy before though not biofeedback.  At least I do not think so.  She will follow up with her home exercise program that was recommended through physical therapy regarding pelvic floor exercises in the past.  She will consider reevaluation and retreatment in PT.  I have asked her to see Robyn Hobbs to try to look for food intolerance issues that might help with her gas symptoms.  She has tried Beano and she did not respond to antibiotics for small intestinal bacterial overgrowth.  She has worked on some FODMAPs type stuff and has some foods picked out as triggers but we agree it is reasonable to be a bit more systematic about it and to use a Hobbs.  I appreciate the opportunity to care for this patient. CC: Robyn Hobbs  Subjective:   Chief Complaint:  HPI Robyn Hobbs is a 70 year old white woman that I have seen over the years for irritable bowel syndrome with constipation and diarrhea, collagenous colitis, anal sphincter incompetence, small intestinal bacterial overgrowth.  She presents today returning after being evaluated at [facility] last year.  Her daughter-in-law is a dermatologist to helped [place] consider repeating a colonoscopy with random biopsies that led to the diagnosis of collagenous colitis and Robyn Hobbs, and she was discussing Robyn Hobbs's ongoing GI symptoms with a colleague who recommended that Robyn Hobbs see Robyn Hobbs at [facility] [place].  She did so on a virtual visit with one of the  fellows and was evaluated with an anorectal manometry which demonstrated an hypotensive internal anal sphincter and an external anal sphincter that was unable to generate and maintain squeeze duration.  Adequate contraction of VAS during cough.  There was no asymmetry of the anal sphincter.  There was paradoxical contraction of the external anal sphincter during strain with adequate intrarectal pressure consistent with dyssynergia type I.  Biofeedback and/or pelvic floor physical therapy was recommended.  She has done pelvic floor physical therapy before and says she can do the exercises at home but sounds somewhat unenthusiastic about that.  An endoanal ultrasound was also done that demonstrated no external or internal anal sphincter abnormalities.  She says that she is "just the way I was when I first met you" at this point in time.  She is having constipation issues which she would "take any day over diarrhea".  She is occasionally or rarely using some budesonide to control flares of diarrhea.  She only takes 3 mg at a time.  Her main issue now is that she cannot control her flatus.  She would like to be able to do so.  This is a social issue for her. Allergies  Allergen Reactions  . Morphine And Related Swelling   Current Meds  Medication Sig  . beta carotene w/minerals (OCUVITE) tablet Take 1 tablet by mouth daily.  . calcium carbonate (OSCAL) 1500 (600 Ca) MG TABS tablet Take 600 mg of elemental calcium by mouth 2 (two) times daily with a meal.  . cholecalciferol (  VITAMIN D) 1000 UNITS tablet Take 2,000 Units by mouth daily.  . Cranberry 1000 MG CAPS Take 1 capsule by mouth daily.  . tamoxifen (NOLVADEX) 20 MG tablet TAKE 1 TABLET(20 MG) BY MOUTH DAILY   Past Medical History:  Diagnosis Date  . Allergy   . Arthritis   . Breast cancer ([place])   . Breast cancer of upper-inner quadrant of right female breast ([place]) 02/25/2016  . Cataract   . Collagenous colitis   . History of radiation therapy  05/18/16- 06/08/16   Right Breast 42.56 Gy in 16 fractions.   . Increased liver enzymes   . Osteopenia   . Personal history of radiation therapy   . Small intestinal bacterial overgrowth 03/21/2019   44 ppm increase H2, 13 ppm methane 57 ppm total lactulose breath test   . Small intestinal bacterial overgrowth    Past Surgical History:  Procedure Laterality Date  . BLADDER REPAIR    . BREAST BIOPSY    . BREAST LUMPECTOMY    . BREAST LUMPECTOMY WITH RADIOACTIVE SEED AND SENTINEL LYMPH NODE BIOPSY Right 03/17/2016   Procedure: BREAST LUMPECTOMY WITH RADIOACTIVE SEED AND SENTINEL LYMPH NODE BIOPSY;  Surgeon: Robyn Messing III, Hobbs;  Location: [facility];  Service: General;  Laterality: Right;  BREAST LUMPECTOMY WITH RADIOACTIVE SEED AND SENTINEL LYMPH NODE BIOPSY  . COLONOSCOPY  multiple  . Excision of Melanoma  on back    . TONSILLECTOMY     and adenoidectomy  . VAGINAL HYSTERECTOMY  2001   Social History   Social History Narrative   Married to [place]   never smoker, rare alcohol no drug use   Originally from [place]   family history includes Colon cancer in her maternal grandmother, paternal grandfather, and paternal grandmother; Kidney disease in her father.   Review of Systems As above  Objective:   Physical Exam BP 122/64   Pulse 67   Ht $R'5\' 5"'kD$  (1.651 m)   Wt 124 lb 9.6 oz (56.5 kg)   BMI 20.73 kg/m  No acute distress 30 minutes total time spent in this visit before during and after the visit

## 2020-08-25 NOTE — Patient Instructions (Signed)
Please do your pelvic floor home exercise program and follow up with Dr Carlean Purl as needed.  Tonie Griffith is the Dietician he recommends you reach out to. Her phone # is [phone].   I appreciate the opportunity to care for you. Silvano Rusk, MD, [facility]

## 2020-08-26 ENCOUNTER — Encounter: Payer: Self-pay | Admitting: Internal Medicine

## 2020-08-26 DIAGNOSIS — K5902 Outlet dysfunction constipation: Secondary | ICD-10-CM

## 2020-08-26 HISTORY — DX: Outlet dysfunction constipation: K59.02

## 2020-08-28 DIAGNOSIS — A049 Bacterial intestinal infection, unspecified: Secondary | ICD-10-CM | POA: Diagnosis not present

## 2020-08-28 DIAGNOSIS — Z170 Estrogen receptor positive status [ER+]: Secondary | ICD-10-CM | POA: Diagnosis not present

## 2020-08-28 DIAGNOSIS — R159 Full incontinence of feces: Secondary | ICD-10-CM | POA: Diagnosis not present

## 2020-08-28 DIAGNOSIS — K52831 Collagenous colitis: Secondary | ICD-10-CM | POA: Diagnosis not present

## 2020-08-28 DIAGNOSIS — K5909 Other constipation: Secondary | ICD-10-CM | POA: Diagnosis not present

## 2020-08-28 DIAGNOSIS — C50211 Malignant neoplasm of upper-inner quadrant of right female breast: Secondary | ICD-10-CM | POA: Diagnosis not present

## 2020-10-01 ENCOUNTER — Other Ambulatory Visit: Payer: Self-pay | Admitting: Oncology

## 2020-11-09 DIAGNOSIS — D692 Other nonthrombocytopenic purpura: Secondary | ICD-10-CM | POA: Diagnosis not present

## 2020-11-09 DIAGNOSIS — L245 Irritant contact dermatitis due to other chemical products: Secondary | ICD-10-CM | POA: Diagnosis not present

## 2020-11-09 DIAGNOSIS — L570 Actinic keratosis: Secondary | ICD-10-CM | POA: Diagnosis not present

## 2020-11-09 DIAGNOSIS — Z85828 Personal history of other malignant neoplasm of skin: Secondary | ICD-10-CM | POA: Diagnosis not present

## 2020-11-09 DIAGNOSIS — L821 Other seborrheic keratosis: Secondary | ICD-10-CM | POA: Diagnosis not present

## 2020-11-17 DIAGNOSIS — Z961 Presence of intraocular lens: Secondary | ICD-10-CM | POA: Diagnosis not present

## 2020-11-17 DIAGNOSIS — H43813 Vitreous degeneration, bilateral: Secondary | ICD-10-CM | POA: Diagnosis not present

## 2020-11-17 DIAGNOSIS — H353132 Nonexudative age-related macular degeneration, bilateral, intermediate dry stage: Secondary | ICD-10-CM | POA: Diagnosis not present

## 2020-11-28 DIAGNOSIS — Z20822 Contact with and (suspected) exposure to covid-19: Secondary | ICD-10-CM | POA: Diagnosis not present

## 2020-12-18 DIAGNOSIS — N390 Urinary tract infection, site not specified: Secondary | ICD-10-CM | POA: Diagnosis not present

## 2020-12-18 DIAGNOSIS — K529 Noninfective gastroenteritis and colitis, unspecified: Secondary | ICD-10-CM | POA: Diagnosis not present

## 2021-01-01 ENCOUNTER — Other Ambulatory Visit: Payer: Self-pay | Admitting: Oncology

## 2021-01-06 ENCOUNTER — Other Ambulatory Visit: Payer: Self-pay | Admitting: Adult Health

## 2021-02-18 ENCOUNTER — Other Ambulatory Visit: Payer: Self-pay | Admitting: Oncology

## 2021-02-18 DIAGNOSIS — Z9889 Other specified postprocedural states: Secondary | ICD-10-CM

## 2021-03-06 ENCOUNTER — Other Ambulatory Visit: Payer: Self-pay | Admitting: Internal Medicine

## 2021-04-12 DIAGNOSIS — H00014 Hordeolum externum left upper eyelid: Secondary | ICD-10-CM | POA: Diagnosis not present

## 2021-05-10 ENCOUNTER — Ambulatory Visit
Admission: RE | Admit: 2021-05-10 | Discharge: 2021-05-10 | Disposition: A | Discharge status: Home / Self Care | Payer: Medicare HMO | Source: Ambulatory Visit | Attending: Oncology | Admitting: Oncology

## 2021-05-10 ENCOUNTER — Other Ambulatory Visit: Payer: Self-pay

## 2021-05-10 DIAGNOSIS — Z9889 Other specified postprocedural states: Secondary | ICD-10-CM

## 2021-05-10 DIAGNOSIS — R922 Inconclusive mammogram: Secondary | ICD-10-CM | POA: Diagnosis not present

## 2021-05-11 DIAGNOSIS — C44729 Squamous cell carcinoma of skin of left lower limb, including hip: Secondary | ICD-10-CM | POA: Diagnosis not present

## 2021-05-11 DIAGNOSIS — Z85828 Personal history of other malignant neoplasm of skin: Secondary | ICD-10-CM | POA: Diagnosis not present

## 2021-05-11 DIAGNOSIS — L821 Other seborrheic keratosis: Secondary | ICD-10-CM | POA: Diagnosis not present

## 2021-05-18 ENCOUNTER — Other Ambulatory Visit: Payer: Self-pay | Admitting: *Deleted

## 2021-05-18 DIAGNOSIS — C50211 Malignant neoplasm of upper-inner quadrant of right female breast: Secondary | ICD-10-CM

## 2021-05-18 DIAGNOSIS — Z170 Estrogen receptor positive status [ER+]: Secondary | ICD-10-CM

## 2021-05-18 NOTE — Progress Notes (Signed)
x

## 2021-05-19 ENCOUNTER — Inpatient Hospital Stay: Payer: Medicare HMO

## 2021-05-19 ENCOUNTER — Other Ambulatory Visit: Payer: Self-pay

## 2021-05-19 ENCOUNTER — Inpatient Hospital Stay: Payer: Medicare HMO | Attending: Oncology | Admitting: Oncology

## 2021-05-19 VITALS — BP 124/69 | HR 62 | Temp 97.7°F | Resp 17 | Wt 128.4 lb

## 2021-05-19 DIAGNOSIS — K589 Irritable bowel syndrome without diarrhea: Secondary | ICD-10-CM | POA: Insufficient documentation

## 2021-05-19 DIAGNOSIS — Z79810 Long term (current) use of selective estrogen receptor modulators (SERMs): Secondary | ICD-10-CM | POA: Diagnosis not present

## 2021-05-19 DIAGNOSIS — Z800 Family history of malignant neoplasm of digestive organs: Secondary | ICD-10-CM | POA: Insufficient documentation

## 2021-05-19 DIAGNOSIS — Z841 Family history of disorders of kidney and ureter: Secondary | ICD-10-CM | POA: Insufficient documentation

## 2021-05-19 DIAGNOSIS — Z85820 Personal history of malignant melanoma of skin: Secondary | ICD-10-CM | POA: Diagnosis not present

## 2021-05-19 DIAGNOSIS — Z170 Estrogen receptor positive status [ER+]: Secondary | ICD-10-CM | POA: Diagnosis not present

## 2021-05-19 DIAGNOSIS — Z90722 Acquired absence of ovaries, bilateral: Secondary | ICD-10-CM | POA: Diagnosis not present

## 2021-05-19 DIAGNOSIS — Z885 Allergy status to narcotic agent status: Secondary | ICD-10-CM | POA: Insufficient documentation

## 2021-05-19 DIAGNOSIS — Z7952 Long term (current) use of systemic steroids: Secondary | ICD-10-CM | POA: Diagnosis not present

## 2021-05-19 DIAGNOSIS — Z923 Personal history of irradiation: Secondary | ICD-10-CM | POA: Diagnosis not present

## 2021-05-19 DIAGNOSIS — C50211 Malignant neoplasm of upper-inner quadrant of right female breast: Secondary | ICD-10-CM | POA: Diagnosis not present

## 2021-05-19 LAB — CBC WITH DIFFERENTIAL (CANCER CENTER ONLY)
Abs Immature Granulocytes: 0 10*3/uL (ref 0.00–0.07)
Basophils Absolute: 0.1 10*3/uL (ref 0.0–0.1)
Basophils Relative: 1 %
Eosinophils Absolute: 0.3 10*3/uL (ref 0.0–0.5)
Eosinophils Relative: 5 %
HCT: 39.5 % (ref 36.0–46.0)
Hemoglobin: 12.8 g/dL (ref 12.0–15.0)
Immature Granulocytes: 0 %
Lymphocytes Relative: 27 %
Lymphs Abs: 1.7 10*3/uL (ref 0.7–4.0)
MCH: 28.3 pg (ref 26.0–34.0)
MCHC: 32.4 g/dL (ref 30.0–36.0)
MCV: 87.2 fL (ref 80.0–100.0)
Monocytes Absolute: 0.5 10*3/uL (ref 0.1–1.0)
Monocytes Relative: 8 %
Neutro Abs: 3.9 10*3/uL (ref 1.7–7.7)
Neutrophils Relative %: 59 %
Platelet Count: 201 10*3/uL (ref 150–400)
RBC: 4.53 MIL/uL (ref 3.87–5.11)
RDW: 13.3 % (ref 11.5–15.5)
WBC Count: 6.5 10*3/uL (ref 4.0–10.5)
nRBC: 0 % (ref 0.0–0.2)

## 2021-05-19 LAB — CMP (CANCER CENTER ONLY)
ALT: 20 U/L (ref 0–44)
AST: 19 U/L (ref 15–41)
Albumin: 3.8 g/dL (ref 3.5–5.0)
Alkaline Phosphatase: 49 U/L (ref 38–126)
Anion gap: 8 (ref 5–15)
BUN: 17 mg/dL (ref 8–23)
CO2: 28 mmol/L (ref 22–32)
Calcium: 9.6 mg/dL (ref 8.9–10.3)
Chloride: 103 mmol/L (ref 98–111)
Creatinine: 0.96 mg/dL (ref 0.44–1.00)
GFR, Estimated: >60 mL/min (ref >60)
Glucose, Bld: 89 mg/dL (ref 70–99)
Potassium: 4.5 mmol/L (ref 3.5–5.1)
Sodium: 139 mmol/L (ref 135–145)
Total Bilirubin: 0.5 mg/dL (ref 0.3–1.2)
Total Protein: 6.4 g/dL — ABNORMAL LOW (ref 6.5–8.1)

## 2021-05-19 NOTE — Progress Notes (Addendum)
[place]  [phone] [phone]     ID: Robyn Hobbs DOB: 1950/12/22  MR#: [id number]  DXI#:[id number]  Patient Care Team: Caren Macadam, MD as PCP - General (Family Medicine) Jovita Kussmaul, MD as Consulting Physician (General Surgery) Nyara Capell, Virgie Dad, MD as Consulting Physician (Oncology) Eppie Gibson, MD as Attending Physician (Radiation Oncology) Martinique, Amy, MD as Consulting Physician (Dermatology) Gatha Mayer, MD as Consulting Physician (Gastroenterology) OTHER MD:  CHIEF COMPLAINT: Estrogen receptor positive breast cancer  CURRENT TREATMENT: Completing 5 years of tamoxifen   INTERVAL HISTORY: [name] returns today for follow-up of her estrogen receptor positive breast cancer.   She continues on tamoxifen, with good tolerance.  Hot flashes and vaginal wetness have never been a source of concern.  She has 1 more tablet to go so her last dose will be tomorrow.  Since her last visit, she underwent bilateral diagnostic mammography with tomography at [facility] on 05/10/2021 showing: breast density category C; no evidence of malignancy in either breast.    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  She is very active with her grandchildren.  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  She is not satisfied with the physician she saw at [facility] for irritable bowel and she is working with Dr. Carlean Purl regarding that problem.  Overall a detailed review of systems today was benign.   COVID 19 VACCINATION STATUS: Pfizer x4; also had COVID x1   BREAST CANCER HISTORY: From the original intake note:  [name] had screening mammography suggesting a possible mass in the right breast and [facility] diagnostic mammography with tomography and right breast ultrasonography was performed 02/17/2016. The breast density was category C.  There was set up mass in the posterior third of the upper central right breast which was not palpable by exam. Ultrasonography confirmed an irregular hypoechoic mass measuring approximately 0.9 cm. Survey of the right axilla was unremarkable.  Biopsy of the right breast mass in question 02/19/2016 showed ([id number]) an invasive ductal carcinoma, grade 1, estrogen receptor 100% positive, with strong staining intensity, progesterone receptor negative, with an MIB-1 of 2%, and no HER-2 amplification, the signals ratio being 1.00 and the number per cell 1.05.  Her subsequent history is as detailed below.   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   Increased liver enzymes    Osteopenia    Personal history of radiation therapy    Small intestinal bacterial overgrowth 03/21/2019   44 ppm increase H2, 13 ppm methane 57 ppm total lactulose breath test     PAST SURGICAL HISTORY: Past Surgical History:  Procedure Laterality Date   BLADDER REPAIR     BREAST BIOPSY     BREAST LUMPECTOMY     BREAST LUMPECTOMY WITH RADIOACTIVE SEED AND SENTINEL LYMPH NODE BIOPSY Right 03/17/2016   Procedure: BREAST LUMPECTOMY WITH RADIOACTIVE SEED AND SENTINEL  LYMPH NODE BIOPSY;  Surgeon: Autumn Messing III, MD;  Location: [facility];  Service: General;  Laterality: Right;  BREAST LUMPECTOMY WITH RADIOACTIVE SEED AND SENTINEL LYMPH NODE BIOPSY   COLONOSCOPY  multiple   Excision of Melanoma  on back     TONSILLECTOMY     and adenoidectomy   VAGINAL HYSTERECTOMY  2001    FAMILY  HISTORY Family History  Problem Relation Age of Onset   Kidney disease Father    Colon cancer Maternal Grandmother    Colon cancer Paternal Grandfather    Colon cancer Paternal Grandmother    Esophageal cancer Neg Hx    Liver cancer Neg Hx    Pancreatic cancer Neg Hx    Rectal cancer Neg Hx    Stomach cancer Neg Hx    Breast cancer Neg Hx   The patient's father died at the age of 72 from kidney cancer. The patient's mother died at the age of 72. The patient had one brother, no sisters. On the mother's side both the grandmother and grandfather had colon cancer diagnosed in their 20s. The patient's brother had prostate cancer at age 8. The patient's paternal grandfather was diagnosed with lung cancer in his late 37s. There is no history of breast or ovarian cancer in the family   GYNECOLOGIC HISTORY:  No LMP recorded. Patient is postmenopausal. Menarche age 9, first live birth age 30, the patient is [place] P3. She underwent full abdominal hysterectomy with bilateral salpingo-oophorectomy June 2001. She took hormone replacement for less than a year, stopping in 2002. She used oral contraceptives for approximately 5 years remotely without complications.   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 Her husband Reynolds Bowl") used to work in [employer] but is now retired. Son [name] lives in [place] and is a Hydrologist for [employer], son [name] lives in [place] and runs [employer] that imports [employer] technologist, Log [place]in Lives in [place] Where He Works in [employer]. The Patient Has 6 Grandchildren. She Attends a [employer].    ADVANCED DIRECTIVES: In place   HEALTH MAINTENANCE: Social History   Tobacco Use   Smoking status: Never   Smokeless tobacco: Never  Vaping Use   Vaping Use: Never used  Substance Use Topics   Alcohol use: Yes    Alcohol/week: 3.0 standard drinks    Types: 3 Glasses of wine per week    Comment:  she is not drinking at this time.    Drug use: No     Colonoscopy: 2014/ Dr. Carlean Purl  PAP:  Bone density: July 2017   Allergies  Allergen Reactions   Morphine And Related Swelling    Current Outpatient Medications  Medication Sig Dispense Refill   beta carotene w/minerals (OCUVITE) tablet Take 1 tablet by mouth daily.     budesonide (ENTOCORT EC) 3 MG 24 hr capsule TAKE 3 CAPSULES(9 MG) BY MOUTH DAILY 90 capsule 1   calcium carbonate (OSCAL) 1500 (600 Ca) MG TABS tablet Take 600 mg of elemental calcium by mouth 2 (two) times daily with a meal.     cholecalciferol (VITAMIN D) 1000 UNITS tablet Take 2,000 Units by mouth daily.     Cranberry 1000 MG CAPS Take 1 capsule by mouth daily.     tamoxifen (NOLVADEX) 20 MG tablet TAKE 1 TABLET(20 MG) BY MOUTH DAILY 90 tablet 0   No current facility-administered medications for this visit.    OBJECTIVE: White woman who  appears younger than stated age  70:   05/19/21 0920  BP: 124/69  Pulse: 62  Resp: 17  Temp: 97.7 F (36.5 C)  SpO2: 100%      Body mass index is 21.37 kg/m.    ECOG FS:0 - Asymptomatic  Sclerae unicteric, EOMs intact Wearing a mask No cervical or supraclavicular adenopathy Lungs no rales or rhonchi Heart regular rate and rhythm Abd soft, nontender, positive bowel sounds MSK no focal spinal tenderness, no upper extremity lymphedema Neuro: nonfocal, well oriented, appropriate affect Breasts: The right breast has undergone lumpectomy followed by radiation with no evidence of disease recurrence.  The left breast and both axillae are benign.   LAB RESULTS:  CMP     Component Value Date/Time   NA 139 05/19/2021 0847   NA 141 05/02/2017 1500   K 4.5 05/19/2021 0847   K 4.1 05/02/2017 1500   CL 103 05/19/2021 0847   CO2 28 05/19/2021 0847   CO2 28 05/02/2017 1500   GLUCOSE 89 05/19/2021 0847   GLUCOSE 77 05/02/2017 1500   BUN 17 05/19/2021 0847   BUN 19.0 05/02/2017 1500   CREATININE 0.96 05/19/2021  0847   CREATININE 0.9 05/02/2017 1500   CALCIUM 9.6 05/19/2021 0847   CALCIUM 9.6 05/02/2017 1500   PROT 6.4 (L) 05/19/2021 0847   PROT 7.0 05/02/2017 1500   ALBUMIN 3.8 05/19/2021 0847   ALBUMIN 4.3 05/02/2017 1500   AST 19 05/19/2021 0847   AST 26 05/02/2017 1500   ALT 20 05/19/2021 0847   ALT 29 05/02/2017 1500   ALKPHOS 49 05/19/2021 0847   ALKPHOS 39 (L) 05/02/2017 1500   BILITOT 0.5 05/19/2021 0847   BILITOT 0.62 05/02/2017 1500   GFRNONAA >60 05/19/2021 0847   GFRAA >60 05/17/2019 1322    INo results found for: SPEP, UPEP  Lab Results  Component Value Date   WBC 6.5 05/19/2021   NEUTROABS 3.9 05/19/2021   HGB 12.8 05/19/2021   HCT 39.5 05/19/2021   MCV 87.2 05/19/2021   PLT 201 05/19/2021      Chemistry      Component Value Date/Time   NA 139 05/19/2021 0847   NA 141 05/02/2017 1500   K 4.5 05/19/2021 0847   K 4.1 05/02/2017 1500   CL 103 05/19/2021 0847   CO2 28 05/19/2021 0847   CO2 28 05/02/2017 1500   BUN 17 05/19/2021 0847   BUN 19.0 05/02/2017 1500   CREATININE 0.96 05/19/2021 0847   CREATININE 0.9 05/02/2017 1500      Component Value Date/Time   CALCIUM 9.6 05/19/2021 0847   CALCIUM 9.6 05/02/2017 1500   ALKPHOS 49 05/19/2021 0847   ALKPHOS 39 (L) 05/02/2017 1500   AST 19 05/19/2021 0847   AST 26 05/02/2017 1500   ALT 20 05/19/2021 0847   ALT 29 05/02/2017 1500   BILITOT 0.5 05/19/2021 0847   BILITOT 0.62 05/02/2017 1500       No results found for: LABCA2  No components found for: [id number]  No results for input(s): INR in the last 168 hours.  Urinalysis No results found for: COLORURINE, APPEARANCEUR, LABSPEC, [place], GLUCOSEU, HGBUR, BILIRUBINUR, [place], PROTEINUR, UROBILINOGEN, NITRITE, LEUKOCYTESUR   STUDIES: MM DIAG BREAST TOMO BILATERAL  Result Date: 05/10/2021 CLINICAL DATA:  History of a right lumpectomy for breast carcinoma, performed in 2017. No current complaints. EXAM: DIGITAL DIAGNOSTIC BILATERAL MAMMOGRAM WITH  TOMOSYNTHESIS AND CAD TECHNIQUE: Bilateral digital diagnostic mammography and breast tomosynthesis was performed. The images were evaluated  with computer-aided detection. COMPARISON:  Previous exam(s). ACR Breast Density Category c: The breast tissue is heterogeneously dense, which may obscure small masses. FINDINGS: Post lumpectomy changes on the right are stable. There are no masses, areas of nonsurgical architectural distortion or suspicious calcifications. IMPRESSION: 1. No evidence of new or recurrent breast carcinoma. 2. Benign post lumpectomy changes on the right. RECOMMENDATION: Screening mammogram in one year.(Code:[id number]-B-01Y) I have discussed the findings and recommendations with the patient. If applicable, a reminder letter will be sent to the patient regarding the next appointment. BI-RADS CATEGORY  2: Benign. Electronically Signed   By: Lajean Manes M.D.   On: 05/10/2021 08:07     ELIGIBLE FOR AVAILABLE RESEARCH PROTOCOL: no  ASSESSMENT: 70 y.o. [place], [place] woman status post right breast upper inner quadrant biopsy 02/19/2016 for a clinical T1b N0, clinical stage IA invasive ductal carcinoma, grade 1, estrogen receptor positive, progesterone receptor and HER-2 negative, with an MIB-1 of 2.   (1) status post right lumpectomy and sentinel lymph node sampling 05/17/2016 for a pT1c pN0, stage IA invasive ductal carcinoma, grade 1, with negative margins  (2) Oncotype DX score of 20 predicts a 10 year risk of recurrence outside the breast of 13% if the patient's only systemic treatment is tamoxifen for 5 years; it also predicted no significant benefit from adjuvant chemotherapy.  (3) adjuvant radiation 05/18/16 - 06/08/16 :  Right breast: 42.56 Gy in 16 fractions.   (4) started tamoxifen 07/25/2016, completing 5 years October 2022  PLAN: Latorya is now 5 years out from definitive surgery for her breast cancer with no evidence of disease recurrence.  This is very favorable.  She has completed  pretty much 5 years of tamoxifen.  She is now stopping that medication and returning to routine follow-up through her primary care physician.  All she will need in terms of breast cancer follow-up is her yearly mammography and a yearly physician breast exam.  I will be glad to see Joslyn again at any point in the future if and when the need arises but as of now we are making no further routine appointments for her here.  Total encounter time 20 minutes.*   Marqual Mi, Virgie Dad, MD  05/19/21 9:52 AM Medical Oncology and Hematology [facility] [place], [place] [address] Tel. [phone]    Fax. [phone]   I, Wilburn Mylar, am acting as scribe for Dr. Virgie Dad. Hilbert Briggs.  I, Lurline Del MD, have reviewed the above documentation for accuracy and completeness, and I agree with the above.   *Total Encounter Time as defined by the Centers for Medicare and Medicaid Services includes, in addition to the face-to-face time of a patient visit (documented in the note above) non-face-to-face time: obtaining and reviewing outside history, ordering and reviewing medications, tests or procedures, care coordination (communications with other health care professionals or caregivers) and documentation in the medical record.

## 2021-05-22 ENCOUNTER — Encounter: Payer: Self-pay | Admitting: Oncology

## 2021-08-10 DIAGNOSIS — M8580 Other specified disorders of bone density and structure, unspecified site: Secondary | ICD-10-CM | POA: Diagnosis not present

## 2021-08-10 DIAGNOSIS — K52839 Microscopic colitis, unspecified: Secondary | ICD-10-CM | POA: Diagnosis not present

## 2021-08-10 DIAGNOSIS — Z79899 Other long term (current) drug therapy: Secondary | ICD-10-CM | POA: Diagnosis not present

## 2021-08-10 DIAGNOSIS — N390 Urinary tract infection, site not specified: Secondary | ICD-10-CM | POA: Diagnosis not present

## 2021-08-10 DIAGNOSIS — Z0000 Encounter for general adult medical examination without abnormal findings: Secondary | ICD-10-CM | POA: Diagnosis not present

## 2021-08-10 DIAGNOSIS — Z23 Encounter for immunization: Secondary | ICD-10-CM | POA: Diagnosis not present

## 2021-08-10 DIAGNOSIS — Z13220 Encounter for screening for lipoid disorders: Secondary | ICD-10-CM | POA: Diagnosis not present

## 2021-08-10 DIAGNOSIS — Z780 Asymptomatic menopausal state: Secondary | ICD-10-CM | POA: Diagnosis not present

## 2021-08-12 ENCOUNTER — Other Ambulatory Visit: Payer: Self-pay | Admitting: Family Medicine

## 2021-08-12 DIAGNOSIS — Z780 Asymptomatic menopausal state: Secondary | ICD-10-CM

## 2021-09-17 ENCOUNTER — Other Ambulatory Visit: Payer: Self-pay

## 2021-09-17 ENCOUNTER — Ambulatory Visit: Payer: Medicare HMO

## 2021-10-27 DIAGNOSIS — L853 Xerosis cutis: Secondary | ICD-10-CM | POA: Diagnosis not present

## 2021-10-27 DIAGNOSIS — L700 Acne vulgaris: Secondary | ICD-10-CM | POA: Diagnosis not present

## 2021-10-27 DIAGNOSIS — L821 Other seborrheic keratosis: Secondary | ICD-10-CM | POA: Diagnosis not present

## 2021-10-27 DIAGNOSIS — Z85828 Personal history of other malignant neoplasm of skin: Secondary | ICD-10-CM | POA: Diagnosis not present

## 2021-11-02 ENCOUNTER — Other Ambulatory Visit: Payer: Medicare HMO

## 2021-11-18 ENCOUNTER — Other Ambulatory Visit: Payer: Medicare HMO

## 2021-11-23 DIAGNOSIS — H353132 Nonexudative age-related macular degeneration, bilateral, intermediate dry stage: Secondary | ICD-10-CM | POA: Diagnosis not present

## 2021-11-23 DIAGNOSIS — H0100A Unspecified blepharitis right eye, upper and lower eyelids: Secondary | ICD-10-CM | POA: Diagnosis not present

## 2021-11-23 DIAGNOSIS — H26492 Other secondary cataract, left eye: Secondary | ICD-10-CM | POA: Diagnosis not present

## 2021-11-23 DIAGNOSIS — H0100B Unspecified blepharitis left eye, upper and lower eyelids: Secondary | ICD-10-CM | POA: Diagnosis not present

## 2021-12-22 DIAGNOSIS — C44722 Squamous cell carcinoma of skin of right lower limb, including hip: Secondary | ICD-10-CM | POA: Diagnosis not present

## 2021-12-31 ENCOUNTER — Encounter (HOSPITAL_BASED_OUTPATIENT_CLINIC_OR_DEPARTMENT_OTHER): Payer: Self-pay | Admitting: Emergency Medicine

## 2021-12-31 ENCOUNTER — Emergency Department (HOSPITAL_BASED_OUTPATIENT_CLINIC_OR_DEPARTMENT_OTHER)
Admission: EM | Admit: 2021-12-31 | Discharge: 2021-12-31 | Disposition: A | Discharge status: Home / Self Care | Payer: Medicare HMO | Attending: Emergency Medicine | Admitting: Emergency Medicine

## 2021-12-31 ENCOUNTER — Other Ambulatory Visit: Payer: Self-pay

## 2021-12-31 ENCOUNTER — Emergency Department (HOSPITAL_BASED_OUTPATIENT_CLINIC_OR_DEPARTMENT_OTHER): Payer: Medicare HMO

## 2021-12-31 DIAGNOSIS — Y9301 Activity, walking, marching and hiking: Secondary | ICD-10-CM | POA: Insufficient documentation

## 2021-12-31 DIAGNOSIS — Y92019 Unspecified place in single-family (private) house as the place of occurrence of the external cause: Secondary | ICD-10-CM | POA: Diagnosis not present

## 2021-12-31 DIAGNOSIS — W010XXA Fall on same level from slipping, tripping and stumbling without subsequent striking against object, initial encounter: Secondary | ICD-10-CM | POA: Insufficient documentation

## 2021-12-31 DIAGNOSIS — Z853 Personal history of malignant neoplasm of breast: Secondary | ICD-10-CM | POA: Insufficient documentation

## 2021-12-31 DIAGNOSIS — S52501A Unspecified fracture of the lower end of right radius, initial encounter for closed fracture: Secondary | ICD-10-CM | POA: Insufficient documentation

## 2021-12-31 DIAGNOSIS — S6991XA Unspecified injury of right wrist, hand and finger(s), initial encounter: Secondary | ICD-10-CM | POA: Diagnosis not present

## 2021-12-31 MED ORDER — LIDOCAINE-EPINEPHRINE (PF) 2 %-1:200000 IJ SOLN
10.0000 mL | Freq: Once | INTRAMUSCULAR | Status: AC
  Administered 2021-12-31: 10 mL
  Filled 2021-12-31: qty 20

## 2021-12-31 NOTE — ED Triage Notes (Signed)
Patient arrived via POV c/o right wrist injury with mechanical fall. Patient states slipping on floor. Patient denies LOC. Wrist with deformity. Patient is AO x 4, VS WDL, normal gait.

## 2021-12-31 NOTE — Discharge Instructions (Addendum)
Keep the splint on at all times.  No lifting with the right arm.  Try to elevate above your heart to help with swelling

## 2021-12-31 NOTE — ED Provider Notes (Addendum)
[facility] EMERGENCY DEPARTMENT Provider Note   CSN: [id number] Arrival date & time: 12/31/21  1909     History  Chief Complaint  Patient presents with   Wrist Injury    Robyn Hobbs is a 71 y.o. female.  Patient is a 71 year old female with a history of breast cancer status post treatment IBS and IgA deficiency who is presenting today with an injury to her right wrist.  She was at a friend's house and she was walking when she tripped due to the sandal she was wearing and fell forward.  She denies hitting her head or loss of consciousness.  She was able to stand without any pain in her legs but had pain in her right arm and noted deformity.  She denies any numbness or tingling in her fingers and has no shoulder or elbow pain.  The history is provided by the patient.  Wrist Injury Location:  Wrist      Home Medications Prior to Admission medications   Medication Sig Start Date End Date Taking? Authorizing Provider  beta carotene w/minerals (OCUVITE) tablet Take 1 tablet by mouth daily.    [provider]  budesonide (ENTOCORT EC) 3 MG 24 hr capsule TAKE 3 CAPSULES(9 MG) BY MOUTH DAILY 03/08/21   Gatha Mayer, MD  calcium carbonate (OSCAL) 1500 (600 Ca) MG TABS tablet Take 600 mg of elemental calcium by mouth 2 (two) times daily with a meal.    [provider]  cholecalciferol (VITAMIN D) 1000 UNITS tablet Take 2,000 Units by mouth daily.    [provider]  Cranberry 1000 MG CAPS Take 1 capsule by mouth daily.    [provider]  tamoxifen (NOLVADEX) 20 MG tablet TAKE 1 TABLET(20 MG) BY MOUTH DAILY 01/06/21   Causey, Charlestine Massed, NP      Allergies    Morphine and related    Review of Systems   Review of Systems  Physical Exam Updated Vital Signs BP 125/73 (BP Location: Left Arm)   Pulse 77   Temp 98.2 F (36.8 C) (Oral)   Resp 18   Ht '5\' 4"'$  (1.626 m)   Wt 57.2 kg   SpO2 97%   BMI 21.63 kg/m  Physical  Exam Vitals and nursing note reviewed.  HENT:     Head: Normocephalic.  Cardiovascular:     Rate and Rhythm: Normal rate.  Musculoskeletal:        General: Swelling, tenderness and deformity present.     Comments: Right wrist with tenderness and deformity.  Normal sensation and movement of the fingers in the right hand.  2+ radial pulse.  Right elbow and shoulder are normal  Skin:    General: Skin is warm and dry.  Neurological:     Mental Status: She is alert. Mental status is at baseline.  Psychiatric:        Mood and Affect: Mood normal.     ED Results / Procedures / Treatments   Labs (all labs ordered are listed, but only abnormal results are displayed) Labs Reviewed - No data to display  EKG None  Radiology DG Wrist Complete Right  Result Date: 12/31/2021 CLINICAL DATA:  Fall with deformity EXAM: RIGHT WRIST - COMPLETE 3+ VIEW COMPARISON:  None Available. FINDINGS: Impaction fracture of the distal radius. There is dorsal angulation. Radiocarpal joint intact. IMPRESSION: Fracture distal radius with dorsal angulation Radiocarpal joint tact. Electronically Signed   By: Suzy Bouchard M.D.   On: 12/31/2021  20:05    Procedures Reduction of fracture  Date/Time: 12/31/2021 9:44 PM  Performed by: Blanchie Dessert, MD Authorized by: Blanchie Dessert, MD  Consent: Verbal consent obtained. Consent given by: patient Imaging studies: imaging studies available Patient identity confirmed: verbally with patient Local anesthesia used: yes Anesthesia: hematoma block  Anesthesia: Local anesthesia used: yes Local Anesthetic: lidocaine 2% with epinephrine Anesthetic total: 8 mL  Sedation: Patient sedated: no  Patient tolerance: patient tolerated the procedure well with no immediate complications Comments: Pt placed in finger trap with traction and deformity improved.        Medications Ordered in ED Medications  lidocaine-EPINEPHrine (XYLOCAINE W/EPI) 2 %-1:200000 (PF)  injection 10 mL (has no administration in time range)    ED Course/ Medical Decision Making/ A&P                           Medical Decision Making Amount and/or Complexity of Data Reviewed Radiology: ordered and independent interpretation performed. Decision-making details documented in ED Course.  Risk Prescription drug management.   Elderly female presenting today after a fall.  She does not take any anticoagulation and did not have any head injuries.  Only injury based on exam today is in her right wrist.  There is deformity.  Neurovascularly intact. I have independently visualized and interpreted pt's images today. Which shows a right distal radius fracture with angulation.  Ulna appears intact.  Radiology reports intact radiocarpal joint.  Hematoma block performed in an attempt to reduce fracture.  Patient was placed in finger traps. 9:46 PM Visually deformity is improved.  Patient placed in a sugar-tong splint.  We will have him follow-up with hand surgery.  Sling was provided. 9:46 PM Evaluated after splint was placed with good capillary refill and neurologically intact.        Final Clinical Impression(s) / ED Diagnoses Final diagnoses:  Closed fracture of distal end of right radius, unspecified fracture morphology, initial encounter    Rx / DC Orders ED Discharge Orders     None         Blanchie Dessert, MD 12/31/21 4193    Blanchie Dessert, MD 12/31/21 2205

## 2022-01-03 DIAGNOSIS — S52501A Unspecified fracture of the lower end of right radius, initial encounter for closed fracture: Secondary | ICD-10-CM | POA: Diagnosis not present

## 2022-01-04 DIAGNOSIS — X58XXXA Exposure to other specified factors, initial encounter: Secondary | ICD-10-CM | POA: Diagnosis not present

## 2022-01-04 DIAGNOSIS — Y999 Unspecified external cause status: Secondary | ICD-10-CM | POA: Diagnosis not present

## 2022-01-04 DIAGNOSIS — S52501A Unspecified fracture of the lower end of right radius, initial encounter for closed fracture: Secondary | ICD-10-CM | POA: Diagnosis not present

## 2022-01-04 DIAGNOSIS — S52571A Other intraarticular fracture of lower end of right radius, initial encounter for closed fracture: Secondary | ICD-10-CM | POA: Diagnosis not present

## 2022-01-11 DIAGNOSIS — H00014 Hordeolum externum left upper eyelid: Secondary | ICD-10-CM | POA: Diagnosis not present

## 2022-01-14 DIAGNOSIS — S52501D Unspecified fracture of the lower end of right radius, subsequent encounter for closed fracture with routine healing: Secondary | ICD-10-CM | POA: Diagnosis not present

## 2022-01-31 DIAGNOSIS — C44722 Squamous cell carcinoma of skin of right lower limb, including hip: Secondary | ICD-10-CM | POA: Diagnosis not present

## 2022-02-14 DIAGNOSIS — S52501D Unspecified fracture of the lower end of right radius, subsequent encounter for closed fracture with routine healing: Secondary | ICD-10-CM | POA: Diagnosis not present

## 2022-02-14 DIAGNOSIS — L570 Actinic keratosis: Secondary | ICD-10-CM | POA: Diagnosis not present

## 2022-03-18 DIAGNOSIS — H0014 Chalazion left upper eyelid: Secondary | ICD-10-CM | POA: Diagnosis not present

## 2022-03-22 ENCOUNTER — Ambulatory Visit
Admission: RE | Admit: 2022-03-22 | Discharge: 2022-03-22 | Disposition: A | Discharge status: Home / Self Care | Payer: Medicare HMO | Source: Ambulatory Visit | Attending: Family Medicine | Admitting: Family Medicine

## 2022-03-22 DIAGNOSIS — M85852 Other specified disorders of bone density and structure, left thigh: Secondary | ICD-10-CM | POA: Diagnosis not present

## 2022-03-22 DIAGNOSIS — Z780 Asymptomatic menopausal state: Secondary | ICD-10-CM | POA: Diagnosis not present

## 2022-03-31 ENCOUNTER — Other Ambulatory Visit: Payer: Self-pay | Admitting: Family Medicine

## 2022-03-31 DIAGNOSIS — Z1231 Encounter for screening mammogram for malignant neoplasm of breast: Secondary | ICD-10-CM

## 2022-04-01 DIAGNOSIS — Z85828 Personal history of other malignant neoplasm of skin: Secondary | ICD-10-CM | POA: Diagnosis not present

## 2022-04-01 DIAGNOSIS — D692 Other nonthrombocytopenic purpura: Secondary | ICD-10-CM | POA: Diagnosis not present

## 2022-04-01 DIAGNOSIS — L570 Actinic keratosis: Secondary | ICD-10-CM | POA: Diagnosis not present

## 2022-04-01 DIAGNOSIS — L821 Other seborrheic keratosis: Secondary | ICD-10-CM | POA: Diagnosis not present

## 2022-05-11 DIAGNOSIS — C44722 Squamous cell carcinoma of skin of right lower limb, including hip: Secondary | ICD-10-CM | POA: Diagnosis not present

## 2022-05-11 DIAGNOSIS — C44629 Squamous cell carcinoma of skin of left upper limb, including shoulder: Secondary | ICD-10-CM | POA: Diagnosis not present

## 2022-05-11 DIAGNOSIS — D485 Neoplasm of uncertain behavior of skin: Secondary | ICD-10-CM | POA: Diagnosis not present

## 2022-05-12 ENCOUNTER — Ambulatory Visit
Admission: RE | Admit: 2022-05-12 | Discharge: 2022-05-12 | Disposition: A | Discharge status: Home / Self Care | Payer: Medicare HMO | Source: Ambulatory Visit | Attending: Family Medicine | Admitting: Family Medicine

## 2022-05-12 DIAGNOSIS — Z1231 Encounter for screening mammogram for malignant neoplasm of breast: Secondary | ICD-10-CM | POA: Diagnosis not present

## 2022-06-27 DIAGNOSIS — C44722 Squamous cell carcinoma of skin of right lower limb, including hip: Secondary | ICD-10-CM | POA: Diagnosis not present

## 2022-07-04 DIAGNOSIS — C44722 Squamous cell carcinoma of skin of right lower limb, including hip: Secondary | ICD-10-CM | POA: Diagnosis not present

## 2022-07-04 DIAGNOSIS — L853 Xerosis cutis: Secondary | ICD-10-CM | POA: Diagnosis not present

## 2022-07-04 DIAGNOSIS — L821 Other seborrheic keratosis: Secondary | ICD-10-CM | POA: Diagnosis not present

## 2022-07-04 DIAGNOSIS — D692 Other nonthrombocytopenic purpura: Secondary | ICD-10-CM | POA: Diagnosis not present

## 2022-07-04 DIAGNOSIS — L111 Transient acantholytic dermatosis [Grover]: Secondary | ICD-10-CM | POA: Diagnosis not present

## 2022-07-04 DIAGNOSIS — L570 Actinic keratosis: Secondary | ICD-10-CM | POA: Diagnosis not present

## 2022-07-04 DIAGNOSIS — Z85828 Personal history of other malignant neoplasm of skin: Secondary | ICD-10-CM | POA: Diagnosis not present

## 2022-07-12 ENCOUNTER — Other Ambulatory Visit: Payer: Self-pay | Admitting: Internal Medicine

## 2022-08-30 DIAGNOSIS — K52839 Microscopic colitis, unspecified: Secondary | ICD-10-CM | POA: Diagnosis not present

## 2022-08-30 DIAGNOSIS — Z87310 Personal history of (healed) osteoporosis fracture: Secondary | ICD-10-CM | POA: Diagnosis not present

## 2022-08-30 DIAGNOSIS — Z13220 Encounter for screening for lipoid disorders: Secondary | ICD-10-CM | POA: Diagnosis not present

## 2022-08-30 DIAGNOSIS — Z90710 Acquired absence of both cervix and uterus: Secondary | ICD-10-CM | POA: Diagnosis not present

## 2022-08-30 DIAGNOSIS — Z79899 Other long term (current) drug therapy: Secondary | ICD-10-CM | POA: Diagnosis not present

## 2022-08-30 DIAGNOSIS — N390 Urinary tract infection, site not specified: Secondary | ICD-10-CM | POA: Diagnosis not present

## 2022-08-30 DIAGNOSIS — M8589 Other specified disorders of bone density and structure, multiple sites: Secondary | ICD-10-CM | POA: Diagnosis not present

## 2022-08-30 DIAGNOSIS — Z0000 Encounter for general adult medical examination without abnormal findings: Secondary | ICD-10-CM | POA: Diagnosis not present

## 2022-08-30 DIAGNOSIS — Z136 Encounter for screening for cardiovascular disorders: Secondary | ICD-10-CM | POA: Diagnosis not present

## 2022-08-30 DIAGNOSIS — Z9181 History of falling: Secondary | ICD-10-CM | POA: Diagnosis not present

## 2022-09-07 ENCOUNTER — Other Ambulatory Visit: Payer: Self-pay | Admitting: Family Medicine

## 2022-09-07 DIAGNOSIS — Z1231 Encounter for screening mammogram for malignant neoplasm of breast: Secondary | ICD-10-CM

## 2022-09-07 DIAGNOSIS — Z780 Asymptomatic menopausal state: Secondary | ICD-10-CM

## 2022-10-11 DIAGNOSIS — L570 Actinic keratosis: Secondary | ICD-10-CM | POA: Diagnosis not present

## 2022-10-11 DIAGNOSIS — D692 Other nonthrombocytopenic purpura: Secondary | ICD-10-CM | POA: Diagnosis not present

## 2022-10-11 DIAGNOSIS — D485 Neoplasm of uncertain behavior of skin: Secondary | ICD-10-CM | POA: Diagnosis not present

## 2022-10-11 DIAGNOSIS — L821 Other seborrheic keratosis: Secondary | ICD-10-CM | POA: Diagnosis not present

## 2022-10-11 DIAGNOSIS — D0461 Carcinoma in situ of skin of right upper limb, including shoulder: Secondary | ICD-10-CM | POA: Diagnosis not present

## 2022-10-11 DIAGNOSIS — Z85828 Personal history of other malignant neoplasm of skin: Secondary | ICD-10-CM | POA: Diagnosis not present

## 2022-10-11 DIAGNOSIS — M6740 Ganglion, unspecified site: Secondary | ICD-10-CM | POA: Diagnosis not present

## 2022-11-28 DIAGNOSIS — Z85828 Personal history of other malignant neoplasm of skin: Secondary | ICD-10-CM | POA: Diagnosis not present

## 2022-11-28 DIAGNOSIS — L820 Inflamed seborrheic keratosis: Secondary | ICD-10-CM | POA: Diagnosis not present

## 2022-11-28 DIAGNOSIS — L821 Other seborrheic keratosis: Secondary | ICD-10-CM | POA: Diagnosis not present

## 2022-11-29 DIAGNOSIS — H353132 Nonexudative age-related macular degeneration, bilateral, intermediate dry stage: Secondary | ICD-10-CM | POA: Diagnosis not present

## 2022-11-29 DIAGNOSIS — H26492 Other secondary cataract, left eye: Secondary | ICD-10-CM | POA: Diagnosis not present

## 2022-11-29 DIAGNOSIS — H43813 Vitreous degeneration, bilateral: Secondary | ICD-10-CM | POA: Diagnosis not present

## 2022-12-28 DIAGNOSIS — L910 Hypertrophic scar: Secondary | ICD-10-CM | POA: Diagnosis not present

## 2022-12-28 DIAGNOSIS — Z85828 Personal history of other malignant neoplasm of skin: Secondary | ICD-10-CM | POA: Diagnosis not present

## 2023-02-07 ENCOUNTER — Other Ambulatory Visit: Payer: Self-pay | Admitting: Internal Medicine

## 2023-02-25 DIAGNOSIS — H01001 Unspecified blepharitis right upper eyelid: Secondary | ICD-10-CM | POA: Diagnosis not present

## 2023-02-25 DIAGNOSIS — H00011 Hordeolum externum right upper eyelid: Secondary | ICD-10-CM | POA: Diagnosis not present

## 2023-03-07 DIAGNOSIS — L821 Other seborrheic keratosis: Secondary | ICD-10-CM | POA: Diagnosis not present

## 2023-03-07 DIAGNOSIS — L910 Hypertrophic scar: Secondary | ICD-10-CM | POA: Diagnosis not present

## 2023-03-07 DIAGNOSIS — Z85828 Personal history of other malignant neoplasm of skin: Secondary | ICD-10-CM | POA: Diagnosis not present

## 2023-03-07 DIAGNOSIS — L570 Actinic keratosis: Secondary | ICD-10-CM | POA: Diagnosis not present

## 2023-03-07 DIAGNOSIS — D692 Other nonthrombocytopenic purpura: Secondary | ICD-10-CM | POA: Diagnosis not present

## 2023-05-15 ENCOUNTER — Other Ambulatory Visit: Payer: Medicare HMO

## 2023-05-15 ENCOUNTER — Ambulatory Visit: Payer: Medicare HMO

## 2023-06-07 ENCOUNTER — Other Ambulatory Visit: Payer: Self-pay | Admitting: Internal Medicine

## 2023-06-07 DIAGNOSIS — B078 Other viral warts: Secondary | ICD-10-CM | POA: Diagnosis not present

## 2023-06-07 DIAGNOSIS — D692 Other nonthrombocytopenic purpura: Secondary | ICD-10-CM | POA: Diagnosis not present

## 2023-06-07 DIAGNOSIS — L570 Actinic keratosis: Secondary | ICD-10-CM | POA: Diagnosis not present

## 2023-06-07 DIAGNOSIS — L821 Other seborrheic keratosis: Secondary | ICD-10-CM | POA: Diagnosis not present

## 2023-06-07 DIAGNOSIS — Z85828 Personal history of other malignant neoplasm of skin: Secondary | ICD-10-CM | POA: Diagnosis not present

## 2023-06-07 DIAGNOSIS — L820 Inflamed seborrheic keratosis: Secondary | ICD-10-CM | POA: Diagnosis not present

## 2023-06-07 DIAGNOSIS — D1801 Hemangioma of skin and subcutaneous tissue: Secondary | ICD-10-CM | POA: Diagnosis not present

## 2023-06-13 ENCOUNTER — Ambulatory Visit
Admission: RE | Admit: 2023-06-13 | Discharge: 2023-06-13 | Disposition: A | Discharge status: Home / Self Care | Payer: Medicare HMO | Source: Ambulatory Visit | Attending: Family Medicine | Admitting: Family Medicine

## 2023-06-13 DIAGNOSIS — Z1231 Encounter for screening mammogram for malignant neoplasm of breast: Secondary | ICD-10-CM | POA: Diagnosis not present

## 2023-06-16 ENCOUNTER — Other Ambulatory Visit: Payer: Self-pay | Admitting: Family Medicine

## 2023-06-16 ENCOUNTER — Encounter: Payer: Self-pay | Admitting: Family Medicine

## 2023-06-16 DIAGNOSIS — R928 Other abnormal and inconclusive findings on diagnostic imaging of breast: Secondary | ICD-10-CM

## 2023-07-17 ENCOUNTER — Ambulatory Visit: Payer: Medicare HMO

## 2023-07-17 ENCOUNTER — Ambulatory Visit
Admission: RE | Admit: 2023-07-17 | Discharge: 2023-07-17 | Disposition: A | Discharge status: Home / Self Care | Payer: Medicare HMO | Source: Ambulatory Visit | Attending: Family Medicine

## 2023-07-17 DIAGNOSIS — R928 Other abnormal and inconclusive findings on diagnostic imaging of breast: Secondary | ICD-10-CM

## 2023-08-23 DIAGNOSIS — M25511 Pain in right shoulder: Secondary | ICD-10-CM | POA: Diagnosis not present

## 2023-08-23 DIAGNOSIS — M542 Cervicalgia: Secondary | ICD-10-CM | POA: Diagnosis not present

## 2023-09-04 DIAGNOSIS — H6123 Impacted cerumen, bilateral: Secondary | ICD-10-CM | POA: Diagnosis not present

## 2023-09-04 DIAGNOSIS — H903 Sensorineural hearing loss, bilateral: Secondary | ICD-10-CM | POA: Diagnosis not present

## 2023-09-05 ENCOUNTER — Other Ambulatory Visit: Payer: Self-pay | Admitting: Internal Medicine

## 2023-09-12 DIAGNOSIS — Z85828 Personal history of other malignant neoplasm of skin: Secondary | ICD-10-CM | POA: Diagnosis not present

## 2023-09-12 DIAGNOSIS — D692 Other nonthrombocytopenic purpura: Secondary | ICD-10-CM | POA: Diagnosis not present

## 2023-09-12 DIAGNOSIS — E559 Vitamin D deficiency, unspecified: Secondary | ICD-10-CM | POA: Diagnosis not present

## 2023-09-12 DIAGNOSIS — Z13220 Encounter for screening for lipoid disorders: Secondary | ICD-10-CM | POA: Diagnosis not present

## 2023-09-12 DIAGNOSIS — Z79899 Other long term (current) drug therapy: Secondary | ICD-10-CM | POA: Diagnosis not present

## 2023-09-12 DIAGNOSIS — L821 Other seborrheic keratosis: Secondary | ICD-10-CM | POA: Diagnosis not present

## 2023-09-12 DIAGNOSIS — L578 Other skin changes due to chronic exposure to nonionizing radiation: Secondary | ICD-10-CM | POA: Diagnosis not present

## 2023-09-12 DIAGNOSIS — K52839 Microscopic colitis, unspecified: Secondary | ICD-10-CM | POA: Diagnosis not present

## 2023-09-19 ENCOUNTER — Other Ambulatory Visit: Payer: Self-pay | Admitting: Family Medicine

## 2023-09-19 DIAGNOSIS — M8580 Other specified disorders of bone density and structure, unspecified site: Secondary | ICD-10-CM | POA: Diagnosis not present

## 2023-09-19 DIAGNOSIS — N390 Urinary tract infection, site not specified: Secondary | ICD-10-CM | POA: Diagnosis not present

## 2023-09-19 DIAGNOSIS — Z7184 Encounter for health counseling related to travel: Secondary | ICD-10-CM | POA: Diagnosis not present

## 2023-09-19 DIAGNOSIS — Z0000 Encounter for general adult medical examination without abnormal findings: Secondary | ICD-10-CM | POA: Diagnosis not present

## 2023-09-19 DIAGNOSIS — K52839 Microscopic colitis, unspecified: Secondary | ICD-10-CM | POA: Diagnosis not present

## 2023-09-19 DIAGNOSIS — Z0184 Encounter for antibody response examination: Secondary | ICD-10-CM | POA: Diagnosis not present

## 2023-09-19 DIAGNOSIS — Z79899 Other long term (current) drug therapy: Secondary | ICD-10-CM | POA: Diagnosis not present

## 2023-09-22 ENCOUNTER — Other Ambulatory Visit: Payer: Self-pay | Admitting: Family Medicine

## 2023-09-22 DIAGNOSIS — M8580 Other specified disorders of bone density and structure, unspecified site: Secondary | ICD-10-CM

## 2023-09-22 DIAGNOSIS — Z1231 Encounter for screening mammogram for malignant neoplasm of breast: Secondary | ICD-10-CM

## 2023-10-13 DIAGNOSIS — K52839 Microscopic colitis, unspecified: Secondary | ICD-10-CM | POA: Diagnosis not present

## 2023-10-13 DIAGNOSIS — N390 Urinary tract infection, site not specified: Secondary | ICD-10-CM | POA: Diagnosis not present

## 2023-10-13 DIAGNOSIS — Z7184 Encounter for health counseling related to travel: Secondary | ICD-10-CM | POA: Diagnosis not present

## 2023-10-23 DIAGNOSIS — H0014 Chalazion left upper eyelid: Secondary | ICD-10-CM | POA: Diagnosis not present

## 2023-10-23 DIAGNOSIS — H01005 Unspecified blepharitis left lower eyelid: Secondary | ICD-10-CM | POA: Diagnosis not present

## 2023-10-23 DIAGNOSIS — H01002 Unspecified blepharitis right lower eyelid: Secondary | ICD-10-CM | POA: Diagnosis not present

## 2023-10-30 DIAGNOSIS — H0014 Chalazion left upper eyelid: Secondary | ICD-10-CM | POA: Diagnosis not present

## 2023-11-16 ENCOUNTER — Encounter: Payer: Self-pay | Admitting: Internal Medicine

## 2023-11-16 ENCOUNTER — Ambulatory Visit (INDEPENDENT_AMBULATORY_CARE_PROVIDER_SITE_OTHER): Payer: Medicare HMO | Admitting: Internal Medicine

## 2023-11-16 VITALS — BP 110/72 | HR 62 | Ht 64.0 in | Wt 132.1 lb

## 2023-11-16 DIAGNOSIS — Z860101 Personal history of adenomatous and serrated colon polyps: Secondary | ICD-10-CM

## 2023-11-16 DIAGNOSIS — K52831 Collagenous colitis: Secondary | ICD-10-CM | POA: Diagnosis not present

## 2023-11-16 DIAGNOSIS — Z800 Family history of malignant neoplasm of digestive organs: Secondary | ICD-10-CM

## 2023-11-16 DIAGNOSIS — K6289 Other specified diseases of anus and rectum: Secondary | ICD-10-CM

## 2023-11-16 MED ORDER — BUDESONIDE 3 MG PO CPEP: 3.0000 mg | ORAL_CAPSULE | Freq: Every day | ORAL | 3 refills | Status: DC

## 2023-11-16 NOTE — Progress Notes (Signed)
 Robyn Hobbs 73 y.o. Dec 06, 1950 [id number]  Assessment & Plan:   Encounter Diagnoses  Name Primary?   Collagenous colitis Yes   Hx of adenomatous polyp of colon    Family history of colon cancer multiple second degree relatives     Patient is doing well.  I have refilled her budesonide .  We have reviewed potential side effects and she is comfortable continuing this.  It is a major benefit to her quality of life by controlling her colitis.  We reviewed her need for colonoscopy.  I actually had suspended or stopped recalls given age and findings.  She had a diminutive adenoma in 2019 she has multiple second-degree relatives with colon cancer.  We will revisit things in November 2026 with a recall at that time.  She is not sure she would repeat one but she is thinking about it so we will keep it on the books.   Office visit return in 2 years May refill budesonide  in between as long as doing well.  Follow-up sooner as needed.  CC: [name], MD   Subjective:   Chief Complaint: Collagenous colitis  HPI 73 year old woman with a history of collagenous colitis, anal sphincter incompetence last seen in February 2022.  She also has known IBS mixed and dyssynergy defecation found at manometry at [facility] plus a history of SIBO and a slightly low IgA level.  She returns today reporting that she is doing well on 3 mg of budesonide  daily.  She tried to go to every other day at the direction of her primary care provider who was concerned about potential side effects but that did not work.  Sometimes she feels she has incomplete defecation but as opposed to the diarrhea problem she was having not on treatment this is better.  Wt Readings from Last 3 Encounters:  11/16/23 132 lb 2 oz (59.9 kg)  12/31/21 126 lb (57.2 kg)  05/19/21 128 lb 7 oz (58.3 kg)     Last colonoscopy November 2021 with endoscopic findings including terminal ileum, and random colon biopsy showing collagenous  colitis.  EGD was also performed it looked normal duodenal biopsies were normal i.e. no celiac disease.  In 2019 there was a diminutive adenoma removed and I had anticipated a 5-year recall at that time prior to her having the colonoscopy in 2021.  She does have a family history of colon cancer in multiple second-degree relatives. Allergies  Allergen Reactions   Morphine And Codeine Swelling   Current Meds  Medication Sig   beta carotene w/minerals (OCUVITE) tablet Take 1 tablet by mouth daily.   budesonide  (ENTOCORT EC ) 3 MG 24 hr capsule TAKE 3 MG BY MOUTH DAILY   calcium carbonate (OSCAL) 1500 (600 Ca) MG TABS tablet Take 600 mg of elemental calcium by mouth 2 (two) times daily with a meal.   cholecalciferol (VITAMIN D) 1000 UNITS tablet Take 2,000 Units by mouth daily.   Cranberry 1000 MG CAPS Take 1 capsule by mouth daily.   Cyanocobalamin  (VITAMIN B 12 PO) Take by mouth daily.   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   Increased liver enzymes    Osteopenia    Personal history of radiation therapy    Small intestinal bacterial overgrowth 03/21/2019   44 ppm increase H2, 13 ppm methane 57 ppm total lactulose breath test    Past Surgical History:  Procedure Laterality Date   BLADDER REPAIR     BREAST BIOPSY     BREAST LUMPECTOMY     BREAST LUMPECTOMY WITH RADIOACTIVE SEED AND SENTINEL LYMPH NODE BIOPSY Right 03/17/2016   Procedure: BREAST LUMPECTOMY WITH RADIOACTIVE SEED AND SENTINEL LYMPH NODE BIOPSY;  Surgeon: Lillette Reid III, MD;  Location: [facility];  Service:  General;  Laterality: Right;  BREAST LUMPECTOMY WITH RADIOACTIVE SEED AND SENTINEL LYMPH NODE BIOPSY   COLONOSCOPY  multiple   Excision of Melanoma  on back     TONSILLECTOMY     and adenoidectomy   VAGINAL HYSTERECTOMY  2001   Social History   Social History Narrative   Married to Robyn Hobbs   never smoker, rare alcohol no drug use   Originally from [place]    family history includes Colon cancer in her maternal grandmother, paternal grandfather, and paternal grandmother; Kidney disease in her father.   Review of Systems  As per HPI Objective:   Physical Exam BP 110/72   Pulse 62   Ht 5\' 4"  (1.626 m)   Wt 132 lb 2 oz (59.9 kg)   BMI 22.68 kg/m

## 2023-11-16 NOTE — Patient Instructions (Signed)
 We have sent the following medications to your pharmacy for you to pick up at your convenience: Budesonide   We will put you in our system for a November 2026 colonoscopy recall.  _______________________________________________________  If your blood pressure at your visit was 140/90 or greater, please contact your primary care physician to follow up on this.  _______________________________________________________  If you are age 75 or older, your body mass index should be between 23-30. Your Body mass index is 22.68 kg/m. If this is out of the aforementioned range listed, please consider follow up with your Primary Care Provider.  If you are age 1 or younger, your body mass index should be between 19-25. Your Body mass index is 22.68 kg/m. If this is out of the aformentioned range listed, please consider follow up with your Primary Care Provider.   ________________________________________________________  The [facility] providers would like to encourage you to use MYCHART to communicate with providers for non-urgent requests or questions.  Due to long hold times on the telephone, sending your provider a message by [facility] may be a faster and more efficient way to get a response.  Please allow 48 business hours for a response.  Please remember that this is for non-urgent requests.  _______________________________________________________   I appreciate the opportunity to care for you. Loy Ruff, MD, [facility]

## 2023-11-21 DIAGNOSIS — H0014 Chalazion left upper eyelid: Secondary | ICD-10-CM | POA: Diagnosis not present

## 2023-12-07 DIAGNOSIS — Z961 Presence of intraocular lens: Secondary | ICD-10-CM | POA: Diagnosis not present

## 2023-12-07 DIAGNOSIS — H0100B Unspecified blepharitis left eye, upper and lower eyelids: Secondary | ICD-10-CM | POA: Diagnosis not present

## 2023-12-07 DIAGNOSIS — H0014 Chalazion left upper eyelid: Secondary | ICD-10-CM | POA: Diagnosis not present

## 2023-12-07 DIAGNOSIS — H0100A Unspecified blepharitis right eye, upper and lower eyelids: Secondary | ICD-10-CM | POA: Diagnosis not present

## 2023-12-12 DIAGNOSIS — D485 Neoplasm of uncertain behavior of skin: Secondary | ICD-10-CM | POA: Diagnosis not present

## 2023-12-12 DIAGNOSIS — L570 Actinic keratosis: Secondary | ICD-10-CM | POA: Diagnosis not present

## 2023-12-12 DIAGNOSIS — L723 Sebaceous cyst: Secondary | ICD-10-CM | POA: Diagnosis not present

## 2023-12-12 DIAGNOSIS — C44722 Squamous cell carcinoma of skin of right lower limb, including hip: Secondary | ICD-10-CM | POA: Diagnosis not present

## 2023-12-12 DIAGNOSIS — Z85828 Personal history of other malignant neoplasm of skin: Secondary | ICD-10-CM | POA: Diagnosis not present

## 2023-12-12 DIAGNOSIS — D225 Melanocytic nevi of trunk: Secondary | ICD-10-CM | POA: Diagnosis not present

## 2023-12-12 DIAGNOSIS — L905 Scar conditions and fibrosis of skin: Secondary | ICD-10-CM | POA: Diagnosis not present

## 2023-12-12 DIAGNOSIS — L821 Other seborrheic keratosis: Secondary | ICD-10-CM | POA: Diagnosis not present

## 2023-12-12 DIAGNOSIS — D692 Other nonthrombocytopenic purpura: Secondary | ICD-10-CM | POA: Diagnosis not present

## 2024-01-23 ENCOUNTER — Encounter: Payer: Self-pay | Admitting: Emergency Medicine

## 2024-01-23 ENCOUNTER — Ambulatory Visit
Admission: EM | Admit: 2024-01-23 | Discharge: 2024-01-23 | Disposition: A | Discharge status: Home / Self Care | Attending: Internal Medicine | Admitting: Internal Medicine

## 2024-01-23 DIAGNOSIS — S81811A Laceration without foreign body, right lower leg, initial encounter: Secondary | ICD-10-CM

## 2024-01-23 NOTE — Discharge Instructions (Signed)
 Wound care: Please keep the area surrounding the wound/sutures clean and dry for the next 24 hours. After 24 hours, you may get the wound wet. Gently clean wound with antibacterial soap. Do not scrub wound. Cover the area with a nonstick bandage if you are going to be in an environment where the wound may become infected.  You should have the sutures removed in 10 days by your primary care provider or at urgent care. Return sooner than 10 days if you experience discharge from your laceration, redness around your laceration, warmth around your laceration, or fever.   You may take over the counter medicines as needed for aches and pains once the numbing wears off.   Thanks for letting me fix your cut today!

## 2024-01-23 NOTE — ED Provider Notes (Signed)
 [facility]    CSN: [id number] Arrival date & time: 01/23/24  1818      History   Chief Complaint Chief Complaint  Patient presents with   Laceration    HPI Robyn Hobbs is a 73 y.o. female.   Robyn Hobbs is a 73 y.o. female presenting for chief complaint of Laceration to the distal right posterior calf that happened shortly prior to arrival at urgent care. She was walking into her pantry through a narrow doorway when she accidentally cut her leg on a broken cheese board. Laceration bled initially, bleeding controlled with pressure. She does not take blood thinners. States laceration does not hurt much and she denies numbness/tingling to the distal right lower extremity. Last tetanus injection was within the last 3-6 months.      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   Flatulence 05/04/2019   Loose stools 05/04/2019   Small intestinal bacterial overgrowth 03/21/2019   Hx of adenomatous polyp of colon 09/27/2017   Malignant neoplasm of upper-inner quadrant of right breast in female, 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   Osteopenia  12/31/2015    Past Surgical History:  Procedure Laterality Date   BLADDER REPAIR     BREAST BIOPSY     BREAST LUMPECTOMY     BREAST LUMPECTOMY WITH RADIOACTIVE SEED AND SENTINEL LYMPH NODE BIOPSY Right 03/17/2016   Procedure: BREAST LUMPECTOMY WITH RADIOACTIVE SEED AND SENTINEL LYMPH NODE BIOPSY;  Surgeon: Deward Null III, MD;  Location: [facility];  Service: General;  Laterality: Right;  BREAST LUMPECTOMY WITH RADIOACTIVE SEED AND SENTINEL LYMPH NODE BIOPSY   COLONOSCOPY  multiple   Excision of Melanoma  on back     TONSILLECTOMY     and adenoidectomy   VAGINAL HYSTERECTOMY  2001    OB History   No obstetric history on file.      Home Medications    Prior to Admission medications   Medication Sig Start Date End Date Taking? Authorizing Provider  beta carotene w/minerals (OCUVITE) tablet Take 1 tablet by mouth daily.    [provider]  budesonide  (ENTOCORT EC ) 3 MG 24 hr capsule Take 1 capsule (3 mg total) by mouth daily. 11/16/23   Avram Lupita BRAVO, MD  calcium carbonate (OSCAL) 1500 (600 Ca) MG TABS tablet Take 600 mg of elemental calcium by mouth 2 (two) times daily with a meal.    [provider]  cholecalciferol (VITAMIN D) 1000 UNITS tablet Take 2,000 Units by mouth daily.    [provider]  Cranberry 1000 MG CAPS  Take 1 capsule by mouth daily.    [provider]  Cyanocobalamin  (VITAMIN B 12 PO) Take by mouth daily.    [provider]    Family History Family History  Problem Relation Age of Onset   Kidney disease Father    Colon cancer Maternal Grandmother    Colon cancer Paternal Grandfather    Colon cancer Paternal Grandmother    Esophageal cancer Neg Hx    Liver cancer Neg Hx    Pancreatic cancer Neg Hx    Rectal cancer Neg Hx    Stomach cancer Neg Hx    Breast cancer Neg Hx     Social History Social History   Tobacco Use   Smoking status: Never   Smokeless tobacco: Never  Vaping Use   Vaping  status: Never Used  Substance Use Topics   Alcohol use: Yes    Alcohol/week: 3.0 standard drinks of alcohol    Types: 3 Glasses of wine per week    Comment: she is not drinking at this time.    Drug use: No     Allergies   Morphine and codeine   Review of Systems Review of Systems Per HPI  Physical Exam Triage Vital Signs ED Triage Vitals [01/23/24 1825]  Encounter Vitals Group     BP (!) 141/70     Girls Systolic BP Percentile      Girls Diastolic BP Percentile      Boys Systolic BP Percentile      Boys Diastolic BP Percentile      Pulse Rate 70     Resp 17     Temp (!) 97.5 F (36.4 C)     Temp Source Oral     SpO2 97 %     Weight      Height      Head Circumference      Peak Flow      Pain Score 0     Pain Loc      Pain Education      Exclude from Growth Chart    No data found.  Updated Vital Signs BP (!) 141/70 (BP Location: Right Arm)   Pulse 70   Temp (!) 97.5 F (36.4 C) (Oral)   Resp 17   SpO2 97%   Visual Acuity Right Eye Distance:   Left Eye Distance:   Bilateral Distance:    Right Eye Near:   Left Eye Near:    Bilateral Near:     Physical Exam Vitals and nursing note reviewed.  Constitutional:      Appearance: She is not ill-appearing or toxic-appearing.  HENT:     Head: Normocephalic and atraumatic.     Right Ear: Hearing and external ear normal.     Left Ear: Hearing and external ear normal.     Nose: Nose normal.     Mouth/Throat:     Lips: Pink.   Eyes:     General: Lids are normal. Vision grossly intact. Gaze aligned appropriately.     Extraocular Movements: Extraocular movements intact.     Conjunctiva/sclera: Conjunctivae normal.   Pulmonary:     Effort: Pulmonary effort is normal.   Musculoskeletal:     Cervical back: Neck supple.   Skin:    General: Skin is warm and dry.     Capillary Refill: Capillary refill takes less than 2 seconds.     Findings: Laceration (3.5cm linear laceration to the posterior right  distal calf as seen  in image below. Non-bleeding, non-tender.) present. No rash.   Neurological:     General: No focal deficit present.     Mental Status: She is alert and oriented to person, place, and time. Mental status is at baseline.     Cranial Nerves: No dysarthria or facial asymmetry.   Psychiatric:        Mood and Affect: Mood normal.        Speech: Speech normal.        Behavior: Behavior normal.        Thought Content: Thought content normal.        Judgment: Judgment normal.    Right calf laceration prior to repair   Right calf laceration after repair    UC Treatments / Results  Labs (all labs ordered are listed, but only abnormal results are displayed) Labs Reviewed - No data to display  EKG   Radiology No results found.  Procedures Laceration Repair  Date/Time: 01/23/2024 7:00 PM  Performed by: Enedelia Dorna HERO, FNP Authorized by: Enedelia Dorna HERO, FNP   Consent:    Consent obtained:  Verbal   Consent given by:  Patient   Risks, benefits, and alternatives were discussed: yes     Risks discussed:  Infection, need for additional repair, nerve damage, pain, poor cosmetic result, poor wound healing, retained foreign body, tendon damage and vascular damage Universal protocol:    Patient identity confirmed:  Verbally with patient Anesthesia:    Anesthesia method:  Local infiltration   Local anesthetic:  Lidocaine  1% WITH epi Laceration details:    Location:  Leg   Leg location:  R lower leg   Length (cm):  3.5   Depth (mm):  5 Exploration:    Imaging outcome: foreign body not noted     Wound exploration: wound explored through full range of motion   Treatment:    Area cleansed with:  Povidone-iodine and chlorhexidine    Amount of cleaning:  Standard Skin repair:    Repair method:  Sutures   Suture size:  4-0   Suture material:  Nylon   Suture technique:  Simple interrupted   Number of sutures:  3 Approximation:    Approximation:   Close Repair type:    Repair type:  Simple Post-procedure details:    Dressing:  Non-adherent dressing   Procedure completion:  Tolerated well, no immediate complications  (including critical care time)  Medications Ordered in UC Medications - No data to display  Initial Impression / Assessment and Plan / UC Course  I have reviewed the triage vital signs and the nursing notes.  Pertinent labs & imaging results that were available during my care of the patient were reviewed by me and considered in my medical decision making (see chart for details).   1. Laceration of right calf without complication Laceration repaired, see procedure note above for details. Discussed wound care and cleaning at home.  Imaging: no clinical indication.  Infection return precautions discussed.  Suture removal in 10 days.  Tdap is already up to date.  Tylenol  as needed for pain at home.  Advised to rest and avoid activities that may increase tension to wound/sutures or expose wound to infection. Excuse note given.   Counseled patient on potential for adverse effects with medications prescribed/recommended today, strict ER and return-to-clinic precautions discussed, patient verbalized understanding.    Final Clinical Impressions(s) / UC Diagnoses   Final diagnoses:  Laceration of right calf without complication, initial encounter  Discharge Instructions      Wound care: Please keep the area surrounding the wound/sutures clean and dry for the next 24 hours. After 24 hours, you may get the wound wet. Gently clean wound with antibacterial soap. Do not scrub wound. Cover the area with a nonstick bandage if you are going to be in an environment where the wound may become infected.  You should have the sutures removed in 10 days by your primary care provider or at urgent care. Return sooner than 10 days if you experience discharge from your laceration, redness around your laceration, warmth around your  laceration, or fever.   You may take over the counter medicines as needed for aches and pains once the numbing wears off.   Thanks for letting me fix your cut today!     ED Prescriptions   None    PDMP not reviewed this encounter.   Enedelia Dorna HERO, [place] 01/23/24 1902

## 2024-01-23 NOTE — ED Triage Notes (Signed)
 Pt presents with laceration right calf that happened today.

## 2024-02-01 ENCOUNTER — Ambulatory Visit
Admission: RE | Admit: 2024-02-01 | Discharge: 2024-02-01 | Disposition: A | Discharge status: Home / Self Care | Source: Ambulatory Visit | Attending: Internal Medicine | Admitting: Internal Medicine

## 2024-02-01 VITALS — BP 113/70 | HR 64 | Temp 97.8°F | Resp 17

## 2024-02-01 DIAGNOSIS — L03115 Cellulitis of right lower limb: Secondary | ICD-10-CM

## 2024-02-01 DIAGNOSIS — S81811A Laceration without foreign body, right lower leg, initial encounter: Secondary | ICD-10-CM | POA: Diagnosis not present

## 2024-02-01 DIAGNOSIS — Z4802 Encounter for removal of sutures: Secondary | ICD-10-CM | POA: Diagnosis not present

## 2024-02-01 MED ORDER — MUPIROCIN 2 % EX OINT: 1.0000 | TOPICAL_OINTMENT | Freq: Two times a day (BID) | CUTANEOUS | 0 refills | Status: AC

## 2024-02-01 MED ORDER — CEPHALEXIN 500 MG PO CAPS: 500.0000 mg | ORAL_CAPSULE | Freq: Two times a day (BID) | ORAL | 0 refills | Status: AC

## 2024-02-01 NOTE — Discharge Instructions (Signed)
 You have cellulitis which is a superficial skin infection.  Take antibiotic every 12 hours with a snack/food for the next 7 days. Apply mupirocin  antibiotic ointment to wound every 12 hours for the next 7 days. No more hydrogen peroxide as this will dry out the wound and irritate skin further.  Watch for worsening signs of infection to the lesion such as spreading redness, swelling, pus drainage, pain, fever/chills. Return if you develop fever, chills, nausea, vomiting, etc as well.

## 2024-02-01 NOTE — ED Provider Notes (Signed)
 [facility]    CSN: [id number] Arrival date & time: 02/01/24  0820      History   Chief Complaint Chief Complaint  Patient presents with   Follow-up    Have stitches removed (from 7/1 visit) - Entered by patient    HPI Robyn Hobbs is a 73 y.o. female.   Robyn Hobbs is a 73 y.o. female presenting for chief complaint of suture removal. She had 3 sutures placed to the posterior right calf on January 23, 2024 [facility] at [facility]. She started noticing a small amount of redness surrounding the wound so she has been using hydrogen peroxide to the wound to clean it for the last several days. The redness has not gotten bigger and she denies pain associated with redness. No swelling, drainage from the wound, calf pain, or recent antibiotic/steroid use in the last 90 days. Denies fevers/chills and streaking redness to the right upper/lower calf.      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   Flatulence 05/04/2019   Loose stools 05/04/2019   Small intestinal bacterial overgrowth 03/21/2019   Hx of adenomatous polyp of colon 09/27/2017   Malignant neoplasm of  upper-inner quadrant of right breast in female, 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   Osteopenia 12/31/2015    Past Surgical History:  Procedure Laterality Date   BLADDER REPAIR     BREAST BIOPSY     BREAST LUMPECTOMY     BREAST LUMPECTOMY WITH RADIOACTIVE SEED AND SENTINEL LYMPH NODE BIOPSY Right 03/17/2016   Procedure: BREAST LUMPECTOMY WITH RADIOACTIVE SEED AND SENTINEL LYMPH NODE BIOPSY;  Surgeon: Deward Null III, MD;  Location: [facility];  Service: General;  Laterality: Right;  BREAST LUMPECTOMY WITH RADIOACTIVE SEED AND SENTINEL LYMPH NODE BIOPSY   COLONOSCOPY  multiple   Excision of Melanoma  on back     TONSILLECTOMY     and adenoidectomy   VAGINAL HYSTERECTOMY  2001    OB History   No obstetric history on file.      Home Medications    Prior to Admission medications   Medication Sig Start Date End Date Taking? Authorizing Provider  cephALEXin  (KEFLEX ) 500 MG capsule Take 1 capsule (500 mg total) by mouth 2 (two) times daily for 7 days. 02/01/24 02/08/24 Yes StanhopeDorna HERO, FNP  mupirocin  ointment (BACTROBAN ) 2 % Apply 1 Application topically 2 (two) times daily. 02/01/24  Yes Enedelia Dorna HERO, FNP  beta carotene w/minerals (OCUVITE) tablet Take 1 tablet by mouth daily.    [provider]  budesonide  (ENTOCORT EC ) 3  MG 24 hr capsule Take 1 capsule (3 mg total) by mouth daily. 11/16/23   Avram Lupita BRAVO, MD  calcium carbonate (OSCAL) 1500 (600 Ca) MG TABS tablet Take 600 mg of elemental calcium by mouth 2 (two) times daily with a meal.    [provider]  cholecalciferol (VITAMIN D) 1000 UNITS tablet Take 2,000 Units by mouth daily.    [provider]  Cranberry 1000 MG CAPS Take 1 capsule by mouth daily.    [provider]  Cyanocobalamin  (VITAMIN B 12 PO) Take by mouth daily.    [provider]    Family History Family History  Problem Relation Age of Onset   Kidney disease Father    Colon  cancer Maternal Grandmother    Colon cancer Paternal Grandfather    Colon cancer Paternal Grandmother    Esophageal cancer Neg Hx    Liver cancer Neg Hx    Pancreatic cancer Neg Hx    Rectal cancer Neg Hx    Stomach cancer Neg Hx    Breast cancer Neg Hx     Social History Social History   Tobacco Use   Smoking status: Never   Smokeless tobacco: Never  Vaping Use   Vaping status: Never Used  Substance Use Topics   Alcohol use: Yes    Alcohol/week: 3.0 standard drinks of alcohol    Types: 3 Glasses of wine per week    Comment: she is not drinking at this time.    Drug use: No     Allergies   Morphine and codeine   Review of Systems Review of Systems Per HPI  Physical Exam Triage Vital Signs ED Triage Vitals  Encounter Vitals Group     BP 02/01/24 0833 113/70     Girls Systolic BP Percentile --      Girls Diastolic BP Percentile --      Boys Systolic BP Percentile --      Boys Diastolic BP Percentile --      Pulse Rate 02/01/24 0833 64     Resp 02/01/24 0833 17     Temp 02/01/24 0833 97.8 F (36.6 C)     Temp Source 02/01/24 0833 Oral     SpO2 02/01/24 0833 96 %     Weight --      Height --      Head Circumference --      Peak Flow --      Pain Score 02/01/24 0835 0     Pain Loc --      Pain Education --      Exclude from Growth Chart --    No data found.  Updated Vital Signs BP 113/70 (BP Location: Right Arm)   Pulse 64   Temp 97.8 F (36.6 C) (Oral)   Resp 17   SpO2 96%   Visual Acuity Right Eye Distance:   Left Eye Distance:   Bilateral Distance:    Right Eye Near:   Left Eye Near:    Bilateral Near:     Physical Exam Vitals and nursing note reviewed.  Constitutional:      Appearance: She is not ill-appearing or toxic-appearing.  HENT:     Head: Normocephalic and atraumatic.     Right Ear: Hearing and external ear normal.     Left Ear: Hearing and external ear normal.     Nose: Nose normal.     Mouth/Throat:     Lips: Pink.   Eyes:  General: Lids are normal. Vision grossly intact. Gaze aligned appropriately.     Extraocular Movements: Extraocular movements intact.     Conjunctiva/sclera: Conjunctivae normal.  Pulmonary:     Effort: Pulmonary effort is normal.  Musculoskeletal:     Cervical back: Neck supple.  Skin:    General: Skin is warm and dry.     Capillary Refill: Capillary refill takes less than 2 seconds.     Findings: Laceration (Linear 4cm laceration to the right posterior calf, small area of redness surrounding wound, no warmth or tenderness to palpation) present. No rash.     Comments: +2 right anterior tibialis pulse, sensation intact distally.   Neurological:     General: No focal deficit present.     Mental Status: She is alert and oriented to person, place, and time. Mental status is at baseline.     Cranial Nerves: No dysarthria or facial asymmetry.  Psychiatric:        Mood and Affect: Mood normal.        Speech: Speech normal.        Behavior: Behavior normal.        Thought Content: Thought content normal.        Judgment: Judgment normal.    Right posterior calf laceration after suture removal   UC Treatments / Results  Labs (all labs ordered are listed, but only abnormal results are displayed) Labs Reviewed - No data to display  EKG   Radiology No results found.  Procedures Procedures (including critical care time)  Medications Ordered in UC Medications - No data to display  Initial Impression / Assessment and Plan / UC Course  I have reviewed the triage vital signs and the nursing notes.  Pertinent labs & imaging results that were available during my care of the patient were reviewed by me and considered in my medical decision making (see chart for details).   1. Laceration of right calf, cellulitis of right leg Sutures removed, unclear if redness/swelling is secondary to skin irritation from hydrogen peroxide use versus infection. Wound appears well  approximated and is non-tender/non-warm to touch.  We'll go ahead and cover for cellulitis with topical mupirocin  BID for 7 days and keflex  BID for 7 days.  Wound care discussed, infection return precautions discussed.   Counseled patient on potential for adverse effects with medications prescribed/recommended today, strict ER and return-to-clinic precautions discussed, patient verbalized understanding.    Final Clinical Impressions(s) / UC Diagnoses   Final diagnoses:  Laceration of right calf  Cellulitis of leg, right     Discharge Instructions      You have cellulitis which is a superficial skin infection.  Take antibiotic every 12 hours with a snack/food for the next 7 days. Apply mupirocin  antibiotic ointment to wound every 12 hours for the next 7 days. No more hydrogen peroxide as this will dry out the wound and irritate skin further.  Watch for worsening signs of infection to the lesion such as spreading redness, swelling, pus drainage, pain, fever/chills. Return if you develop fever, chills, nausea, vomiting, etc as well.    ED Prescriptions     Medication Sig Dispense Auth. Provider   cephALEXin  (KEFLEX ) 500 MG capsule Take 1 capsule (500 mg total) by mouth 2 (two) times daily for 7 days. 14 capsule Jenyfer Trawick M, FNP   mupirocin  ointment (BACTROBAN ) 2 % Apply 1 Application topically 2 (two) times daily. 22 g Enedelia Dorna HERO, FNP      PDMP  not reviewed this encounter.   Enedelia Dorna HERO, [place] 02/01/24 1106

## 2024-02-01 NOTE — ED Triage Notes (Signed)
 Pt presents to have 3 stitches removed for right calf

## 2024-03-19 DIAGNOSIS — D485 Neoplasm of uncertain behavior of skin: Secondary | ICD-10-CM | POA: Diagnosis not present

## 2024-03-19 DIAGNOSIS — L570 Actinic keratosis: Secondary | ICD-10-CM | POA: Diagnosis not present

## 2024-03-19 DIAGNOSIS — D1801 Hemangioma of skin and subcutaneous tissue: Secondary | ICD-10-CM | POA: Diagnosis not present

## 2024-03-19 DIAGNOSIS — L821 Other seborrheic keratosis: Secondary | ICD-10-CM | POA: Diagnosis not present

## 2024-03-19 DIAGNOSIS — Z85828 Personal history of other malignant neoplasm of skin: Secondary | ICD-10-CM | POA: Diagnosis not present

## 2024-03-19 DIAGNOSIS — L820 Inflamed seborrheic keratosis: Secondary | ICD-10-CM | POA: Diagnosis not present

## 2024-03-19 DIAGNOSIS — C44722 Squamous cell carcinoma of skin of right lower limb, including hip: Secondary | ICD-10-CM | POA: Diagnosis not present

## 2024-04-05 DIAGNOSIS — M25511 Pain in right shoulder: Secondary | ICD-10-CM | POA: Diagnosis not present

## 2024-04-16 DIAGNOSIS — M25511 Pain in right shoulder: Secondary | ICD-10-CM | POA: Diagnosis not present

## 2024-04-29 DIAGNOSIS — M19011 Primary osteoarthritis, right shoulder: Secondary | ICD-10-CM | POA: Diagnosis not present

## 2024-05-17 ENCOUNTER — Other Ambulatory Visit: Payer: Medicare HMO

## 2024-05-29 ENCOUNTER — Other Ambulatory Visit: Payer: Self-pay | Admitting: Internal Medicine

## 2024-06-06 ENCOUNTER — Other Ambulatory Visit: Payer: Self-pay | Admitting: Medical Genetics

## 2024-06-11 DIAGNOSIS — Z85828 Personal history of other malignant neoplasm of skin: Secondary | ICD-10-CM | POA: Diagnosis not present

## 2024-06-11 DIAGNOSIS — L570 Actinic keratosis: Secondary | ICD-10-CM | POA: Diagnosis not present

## 2024-06-11 DIAGNOSIS — L821 Other seborrheic keratosis: Secondary | ICD-10-CM | POA: Diagnosis not present

## 2024-06-13 ENCOUNTER — Ambulatory Visit
Admission: RE | Admit: 2024-06-13 | Discharge: 2024-06-13 | Disposition: A | Discharge status: Home / Self Care | Payer: Medicare HMO | Source: Ambulatory Visit | Attending: Family Medicine | Admitting: Family Medicine

## 2024-06-13 DIAGNOSIS — Z1231 Encounter for screening mammogram for malignant neoplasm of breast: Secondary | ICD-10-CM

## 2024-06-18 ENCOUNTER — Other Ambulatory Visit (HOSPITAL_BASED_OUTPATIENT_CLINIC_OR_DEPARTMENT_OTHER)

## 2024-07-11 ENCOUNTER — Other Ambulatory Visit: Payer: Self-pay

## 2024-07-11 DIAGNOSIS — Z006 Encounter for examination for normal comparison and control in clinical research program: Secondary | ICD-10-CM

## 2024-07-23 LAB — GENECONNECT MOLECULAR SCREEN: Genetic Analysis Overall Interpretation: NEGATIVE

## 2024-08-13 ENCOUNTER — Other Ambulatory Visit (HOSPITAL_BASED_OUTPATIENT_CLINIC_OR_DEPARTMENT_OTHER)

## 2024-08-29 ENCOUNTER — Ambulatory Visit (HOSPITAL_BASED_OUTPATIENT_CLINIC_OR_DEPARTMENT_OTHER)
Admission: RE | Admit: 2024-08-29 | Discharge: 2024-08-29 | Disposition: A | Source: Ambulatory Visit | Attending: Family Medicine | Admitting: Family Medicine

## 2024-08-29 DIAGNOSIS — M8580 Other specified disorders of bone density and structure, unspecified site: Secondary | ICD-10-CM
